# Patient Record
Sex: Male | Born: 1935 | Race: White | Hispanic: No | Smoking: Never smoker
Health system: Southern US, Community
[De-identification: ages and names within clinical notes are randomized; demographics above are authoritative.]

## PROBLEM LIST (undated history)

## (undated) DIAGNOSIS — H409 Unspecified glaucoma: Secondary | ICD-10-CM

## (undated) DIAGNOSIS — C4490 Unspecified malignant neoplasm of skin, unspecified: Secondary | ICD-10-CM

## (undated) HISTORY — PX: ESOPHAGEAL DILATION: SHX303

---

## 2003-02-07 ENCOUNTER — Ambulatory Visit (HOSPITAL_COMMUNITY): Admission: RE | Admit: 2003-02-07 | Discharge: 2003-02-07 | Payer: Self-pay | Admitting: Gastroenterology

## 2004-03-17 ENCOUNTER — Ambulatory Visit (HOSPITAL_COMMUNITY): Admission: RE | Admit: 2004-03-17 | Discharge: 2004-03-17 | Payer: Self-pay | Admitting: Gastroenterology

## 2004-03-17 ENCOUNTER — Encounter (INDEPENDENT_AMBULATORY_CARE_PROVIDER_SITE_OTHER): Payer: Self-pay | Admitting: Specialist

## 2006-12-28 ENCOUNTER — Emergency Department (HOSPITAL_COMMUNITY): Admission: EM | Admit: 2006-12-28 | Discharge: 2006-12-28 | Payer: Self-pay | Admitting: Emergency Medicine

## 2010-07-19 ENCOUNTER — Encounter: Payer: Self-pay | Admitting: Rheumatology

## 2010-11-14 NOTE — Op Note (Signed)
NAME:  CRAVEN, CREAN                         ACCOUNT NO.:  [id number]   MEDICAL RECORD NO.:  [id number]                   PATIENT TYPE:  AMB   LOCATION:  ENDO                                 FACILITY:  [facility]   PHYSICIAN:  Danise Edge, M.D.                DATE OF BIRTH:  [birth year]   DATE OF PROCEDURE:  03/17/2004  DATE OF DISCHARGE:                                 OPERATIVE REPORT   PROCEDURE:  Colonoscopy and polypectomy.   INDICATIONS:  Mr. Randy Rojas is an 77 year old male born October 23, 1919.  Mr. Randy Rojas is scheduled to undergo his first screening colonoscopy with  polypectomy to prevent colon cancer.   ENDOSCOPIST:  Danise Edge, M.D.   PREMEDICATION:  Versed 5 mg, Demerol 50 mg.   PROCEDURE:  After obtaining informed consent, Mr. Randy Rojas was placed in the  left lateral decubitus position.  I administered intravenous Demerol and  intravenous Versed to achieve conscious sedation for the procedure.  The  patient's blood pressure, oxygen saturation, and cardiac rhythm were  monitored throughout the procedure and documented in the medical record.   Anal inspection and digital rectal exam was normal.  The prostate was  nonnodular.  The Olympus adjustable pediatric colonoscope was introduced  into the rectum and advanced to the cecum.  Colonic preparation for the exam  today was satisfactory.   RECTUM:  Normal.   SIGMOID COLON/DESCENDING COLON:  Normal.   SPLENIC FLEXURE:  Normal.   TRANSVERSE COLON:  Normal.   HEPATIC FLEXURE:  Normal.   ASCENDING COLON:  From the distal ascending colon two 2 mm sessile polyps  were removed.  One polyp was removed with the electrocautery snare and the  second polyp removed with the cold biopsy forceps.   CECUM AND ILEOCECAL VALVE:  Normal.   ASSESSMENT:  Two small polyps were removed from the ascending colon,  otherwise normal screening proctocolonoscopy of the cecum.      MJ/MEDQ  D:  03/17/2004  T:  03/17/2004  Job:   [id number]   cc:   Demetria Pore. Coral Spikes, M.D.  [address]  [address]  [place]  [place] [address]  Fax: [phone]

## 2010-11-14 NOTE — Op Note (Signed)
NAME:  JAMES, SENN                         ACCOUNT NO.:  [id number]   MEDICAL RECORD NO.:  [id number]                   PATIENT TYPE:  AMB   LOCATION:  ENDO                                 FACILITY:  [facility]   PHYSICIAN:  John C. Madilyn Fireman, M.D.                 DATE OF BIRTH:  January 15, 1920   DATE OF PROCEDURE:  02/07/2003  DATE OF DISCHARGE:                                 OPERATIVE REPORT   PROCEDURE:  Esophagogastroduodenoscopy with removal of foreign body.   INDICATION FOR PROCEDURE:  An 77 year old, white, relatively healthy white  male felt an obstruction in his throat after swallowing a pill this morning.  He was unable to swallow food or keep saliva down since the procedure.  Oropharyngeal exam by Demetria Pore. Levitin, M.D. revealed no obvious foreign  body.  The patient has not had any previous episodes of food or pill  dysphagia.  He is not in any respiratory distress and not having any  odynophagia or chest pain.   DESCRIPTION OF PROCEDURE:  Oropharyngeal exam was repeated and was  unremarkable for any foreign body.  The patient was placed in the left  lateral decubitus position and placed on the pulse monitor with continuous  low-flow oxygen delivered by nasal cannula.  He was sedated with 62.5 mcg IV  fentanyl and 5 mg IV Versed.  The Olympus video endoscope was advanced  carefully into the oropharynx, and careful inspection of the vocal cords,  arytenoid cartilages and valleculae was done before attempt to intubate the  esophagus.  The scope was advanced down the right vallecula and with gentle  pressure, the mucosa yielded and revealed a greenish, round, solid object  that would not easily push forward with the scope.  I elected to use a Roth  basket to try to ensnare it, and this was partly successful and when the  snare was closed, it appeared to break the foreign body in half with one  half retained in the snare which was retrieved through the mouth.  It had a  rubbery,  greenish appearance and was probably consistent with the pill as  described by the patient.  The scope was then re-introduced back into the  same area and this time, the remaining pill fragment easily followed the  scope down into the esophagus.  There was no obvious ring or web or Zenker's  diverticulum appreciated, but I could not completely rule this out.  The  esophagus was straight and of normal caliber with the squamocolumnar line at  38 cm.  There was no visible hiatal hernia, ring, stricture, or other  abnormality of the GE junction or the esophagus.  The stomach was entered,  and a small amount of liquid secretions were suctioned from the fundus.  Retroflexed view of the cardia was unremarkable.  The fundus, body, antrum,  and pylorus all appeared normal.  The duodenum was entered,  and both the  bulb and second portion were well-inspected and appeared to be within normal  limits.  The scope was then withdrawn, and upper esophagus and hypopharynx  again carefully inspected and appeared to be free of any residual foreign  body.  The scope was then withdrawn and the patient returned to the recovery  room in stable condition.  He tolerated the procedure well, and there were  no immediate complications.   IMPRESSION:  1. A foreign body in the cervical esophagus, successfully removed.  2. No underlying ring or stricture appreciated.    PLAN:  Advance diet and if further dysphagia, we will probably obtain a  barium swallow to more conclusively rule out an upper esophageal web or  Zenker's diverticulum.                                               John C. Madilyn Fireman, M.D.    JCH/MEDQ  D:  02/07/2003  T:  02/07/2003  Job:  [id number]   cc:   Demetria Pore. Coral Spikes, M.D.  [address]  [address]  [place]  [place] [address]  Fax: [phone]

## 2011-02-09 ENCOUNTER — Other Ambulatory Visit: Payer: Self-pay | Admitting: Internal Medicine

## 2011-02-09 DIAGNOSIS — R1010 Upper abdominal pain, unspecified: Secondary | ICD-10-CM

## 2011-02-11 ENCOUNTER — Ambulatory Visit
Admission: RE | Admit: 2011-02-11 | Discharge: 2011-02-11 | Disposition: A | Discharge status: Home / Self Care | Payer: Medicare Other | Source: Ambulatory Visit | Attending: Internal Medicine | Admitting: Internal Medicine

## 2011-02-11 DIAGNOSIS — R1010 Upper abdominal pain, unspecified: Secondary | ICD-10-CM

## 2011-02-11 MED ORDER — IOHEXOL 300 MG/ML  SOLN
100.0000 mL | Freq: Once | INTRAMUSCULAR | Status: AC | PRN
  Administered 2011-02-11: 100 mL via INTRAVENOUS

## 2011-02-12 ENCOUNTER — Other Ambulatory Visit: Payer: Self-pay | Admitting: Internal Medicine

## 2011-02-12 DIAGNOSIS — K869 Disease of pancreas, unspecified: Secondary | ICD-10-CM

## 2011-02-19 ENCOUNTER — Other Ambulatory Visit: Payer: Self-pay | Admitting: Internal Medicine

## 2011-02-26 ENCOUNTER — Other Ambulatory Visit: Payer: Medicare Other

## 2011-02-27 ENCOUNTER — Ambulatory Visit
Admission: RE | Admit: 2011-02-27 | Discharge: 2011-02-27 | Disposition: A | Discharge status: Home / Self Care | Payer: Medicare Other | Source: Ambulatory Visit | Attending: Internal Medicine | Admitting: Internal Medicine

## 2011-03-19 ENCOUNTER — Ambulatory Visit (HOSPITAL_COMMUNITY): Payer: Medicare Other

## 2011-03-19 ENCOUNTER — Ambulatory Visit (HOSPITAL_COMMUNITY)
Admission: RE | Admit: 2011-03-19 | Discharge: 2011-03-19 | Disposition: A | Discharge status: Home / Self Care | Payer: Medicare Other | Source: Ambulatory Visit | Attending: Gastroenterology | Admitting: Gastroenterology

## 2011-03-19 DIAGNOSIS — R1310 Dysphagia, unspecified: Secondary | ICD-10-CM | POA: Insufficient documentation

## 2011-03-19 DIAGNOSIS — Q391 Atresia of esophagus with tracheo-esophageal fistula: Secondary | ICD-10-CM | POA: Insufficient documentation

## 2011-03-29 NOTE — Op Note (Signed)
  NAME:  Randy Rojas, Randy Rojas               ACCOUNT NO.:  [id number]  MEDICAL RECORD NO.:  [id number]  LOCATION:  WLEN                         FACILITY:  [facility]  PHYSICIAN:  Danise Edge, M.D.   DATE OF BIRTH:  Nov 03, 1919  DATE OF PROCEDURE:  03/19/2011 DATE OF DISCHARGE:                              OPERATIVE REPORT   REFERRING PHYSICIAN:  Elby Showers, M.D.  PROCEDURE:  Savary esophageal dilation.  HISTORY:  Mr. Randy Rojas is an 77 year old male, born on 1919-10-12.  The patient underwent a barium esophagram with upper GI x-ray series showing a tight cervical esophageal web.  ENDOSCOPIST:  Danise Edge, M.D.  PREMEDICATION: 1. Fentanyl 25 mcg. 2. Versed 4 mg.  PROCEDURE IN DETAIL:  The patient was placed in the left lateral decubitus position.  The Pentax gastroscope was passed through the posterior hypopharynx into the proximal esophagus revealing a tight cervical esophageal web.  I could not traverse the esophageal stricture with the gastroscope.  Under fluoroscopic guidance, the Savary dilator guidewire was passed through the gastroscope down the esophagus and into the stomach.  Under fluoroscopic guidance the 10 mm, 11 mm, and 12 mm Savary dilators were passed without resistance.  The guidewire was removed.  The Pentax gastroscope was passed through the posterior hypopharynx into the proximal esophagus without difficulty.  The hypopharynx, larynx, and vocal cords appeared normal.  Esophagoscopy:  The proximal mid and lower segments of the esophageal mucosa appeared normal.  The cervical esophageal web and had been adequately dilated.  Gastroscopy:  Retroflex view of the gastric cardia and fundus was normal.  The gastric body, antrum, and pylorus appeared normal.  Duodenoscopy:  The duodenal bulb and descending duodenum appeared normal.  ASSESSMENT:  Cervical esophageal web dilated with a 10 mm, 11 mm, and 12 mm Savary dilators.  Otherwise normal  esophagogastroduodenoscopy.          ______________________________ Danise Edge, M.D.     MJ/MEDQ  D:  03/19/2011  T:  03/19/2011  Job:  [id number]  Electronically Signed by Danise Edge M.D. on 03/29/2011 01:44:05 PM

## 2011-08-07 ENCOUNTER — Other Ambulatory Visit: Payer: Medicare Other

## 2011-08-19 ENCOUNTER — Ambulatory Visit
Admission: RE | Admit: 2011-08-19 | Discharge: 2011-08-19 | Disposition: A | Discharge status: Home / Self Care | Payer: Medicare Other | Source: Ambulatory Visit | Attending: Internal Medicine | Admitting: Internal Medicine

## 2011-08-19 DIAGNOSIS — K869 Disease of pancreas, unspecified: Secondary | ICD-10-CM

## 2011-08-19 MED ORDER — IOHEXOL 300 MG/ML  SOLN
100.0000 mL | Freq: Once | INTRAMUSCULAR | Status: AC | PRN
  Administered 2011-08-19: 100 mL via INTRAVENOUS

## 2013-01-08 ENCOUNTER — Encounter (HOSPITAL_COMMUNITY): Payer: Self-pay | Admitting: *Deleted

## 2013-01-08 ENCOUNTER — Emergency Department (HOSPITAL_COMMUNITY)
Admission: EM | Admit: 2013-01-08 | Discharge: 2013-01-08 | Disposition: A | Discharge status: Home / Self Care | Payer: Medicare Other | Attending: Emergency Medicine | Admitting: Emergency Medicine

## 2013-01-08 ENCOUNTER — Emergency Department (INDEPENDENT_AMBULATORY_CARE_PROVIDER_SITE_OTHER)
Admission: EM | Admit: 2013-01-08 | Discharge: 2013-01-08 | Disposition: A | Discharge status: Nursing Home (Includes ICF) | Payer: Medicare Other | Source: Home / Self Care | Attending: Emergency Medicine | Admitting: Emergency Medicine

## 2013-01-08 ENCOUNTER — Emergency Department (HOSPITAL_COMMUNITY): Payer: Medicare Other

## 2013-01-08 DIAGNOSIS — R51 Headache: Secondary | ICD-10-CM | POA: Insufficient documentation

## 2013-01-08 DIAGNOSIS — S1093XA Contusion of unspecified part of neck, initial encounter: Secondary | ICD-10-CM

## 2013-01-08 DIAGNOSIS — H409 Unspecified glaucoma: Secondary | ICD-10-CM | POA: Insufficient documentation

## 2013-01-08 DIAGNOSIS — S0510XA Contusion of eyeball and orbital tissues, unspecified eye, initial encounter: Secondary | ICD-10-CM | POA: Insufficient documentation

## 2013-01-08 DIAGNOSIS — Z23 Encounter for immunization: Secondary | ICD-10-CM

## 2013-01-08 DIAGNOSIS — Y999 Unspecified external cause status: Secondary | ICD-10-CM | POA: Insufficient documentation

## 2013-01-08 DIAGNOSIS — W010XXA Fall on same level from slipping, tripping and stumbling without subsequent striking against object, initial encounter: Secondary | ICD-10-CM | POA: Insufficient documentation

## 2013-01-08 DIAGNOSIS — S00209A Unspecified superficial injury of unspecified eyelid and periocular area, initial encounter: Secondary | ICD-10-CM | POA: Insufficient documentation

## 2013-01-08 DIAGNOSIS — Y929 Unspecified place or not applicable: Secondary | ICD-10-CM | POA: Insufficient documentation

## 2013-01-08 DIAGNOSIS — Y939 Activity, unspecified: Secondary | ICD-10-CM | POA: Insufficient documentation

## 2013-01-08 DIAGNOSIS — S0990XA Unspecified injury of head, initial encounter: Secondary | ICD-10-CM

## 2013-01-08 DIAGNOSIS — S0093XA Contusion of unspecified part of head, initial encounter: Secondary | ICD-10-CM

## 2013-01-08 DIAGNOSIS — Z79899 Other long term (current) drug therapy: Secondary | ICD-10-CM | POA: Insufficient documentation

## 2013-01-08 HISTORY — DX: Unspecified glaucoma: H40.9

## 2013-01-08 MED ORDER — TETANUS-DIPHTH-ACELL PERTUSSIS 5-2.5-18.5 LF-MCG/0.5 IM SUSP
INTRAMUSCULAR | Status: AC
  Filled 2013-01-08: qty 0.5

## 2013-01-08 MED ORDER — TETANUS-DIPHTH-ACELL PERTUSSIS 5-2.5-18.5 LF-MCG/0.5 IM SUSP
0.5000 mL | Freq: Once | INTRAMUSCULAR | Status: AC
  Administered 2013-01-08: 0.5 mL via INTRAMUSCULAR

## 2013-01-08 NOTE — ED Notes (Signed)
Pt sent to ED from [facility] for further eval after tripping and falling on Friday. Pt with abrasions to left side of face/head. No LOC. No vomiting. No HA. Pt is AAOx4. Pt states this happened in [place] and pt drove self back to [place] on same day. Ambulatory with cane as norm

## 2013-01-08 NOTE — ED Provider Notes (Signed)
History    CSN: [id number] Arrival date & time 01/08/13  1607  First MD Initiated Contact with Patient 01/08/13 1618     Chief Complaint  Patient presents with  . Fall    The history is provided by the patient.   patient reports falling 3 days ago.  He states his family and he were concerned about the possibility of a closed head injury and therefore they sought care at the urgent care Center today.  He was transferred to the emergency upper CT scan of his head.  He states he tripped and fell 4 to his left forehead.  He is not on anticoagulants.  Mild left-sided headache headache now.  No altered mental status.  No confusion.  Denies neck pain.  No weakness his upper lower commit.  No other complaints.  He states he may make him because his family was concerned.  He otherwise feels good.  Symptoms are only mild in severity.  Nothing worsens or improves his symptoms   Past Medical History  Diagnosis Date  . Glaucoma    History reviewed. No pertinent past surgical history. History reviewed. No pertinent family history. History  Substance Use Topics  . Smoking status: Never Smoker   . Smokeless tobacco: Not on file  . Alcohol Use: No    Review of Systems  All other systems reviewed and are negative.    Allergies  Review of patient's allergies indicates no known allergies.  Home Medications   Current Outpatient Rx  Name  Route  Sig  Dispense  Refill  . bimatoprost (LUMIGAN) 0.01 % SOLN   Both Eyes   Place 1 drop into both eyes at bedtime.         . hydroxypropyl methylcellulose (ISOPTO TEARS) 2.5 % ophthalmic solution   Both Eyes   Place 1 drop into both eyes 6 (six) times daily.         . timolol (TIMOPTIC) 0.5 % ophthalmic solution   Both Eyes   Place 1 drop into both eyes every morning.          BP 158/61  Pulse 67  Temp(Src) 97.9 F (36.6 C) (Oral)  Resp 16  SpO2 98% Physical Exam  Nursing note and vitals reviewed. Constitutional: He is oriented to  person, place, and time. He appears well-developed and well-nourished.  HENT:  Head: Normocephalic.  Small abrasion is a small hematoma over the left supraorbital rim.  Eyes: EOM are normal.  Neck: Normal range of motion.  Cardiovascular: Normal rate, regular rhythm, normal heart sounds and intact distal pulses.   Pulmonary/Chest: Effort normal and breath sounds normal. No respiratory distress.  Abdominal: Soft. He exhibits no distension. There is no tenderness.  Genitourinary: Rectum normal.  Musculoskeletal: Normal range of motion.  Neurological: He is alert and oriented to person, place, and time.  Skin: Skin is warm and dry.  Psychiatric: He has a normal mood and affect. Judgment normal.    ED Course  Procedures (including critical care time) Labs Reviewed - No data to display Ct Head Wo Contrast  01/08/2013   *RADIOLOGY REPORT*  Clinical Data: Fall 01/06/2013.  Hematoma left supraorbital region.  CT HEAD WITHOUT CONTRAST  Technique:  Contiguous axial images were obtained from the base of the skull through the vertex without contrast.  Comparison: None.  Findings: Soft tissue swelling left supraorbital region.  No underlying fracture or intracranial hemorrhage.  Small streak artifact anterior right frontal lobe.  Global atrophy without hydrocephalus.  Small vessel disease type changes without CT evidence of large acute infarct.  No intracranial mass lesion detected on this unenhanced exam.  Mastoid air cells, middle ear cavities and paranasal sinuses are clear with exception of bilateral ethmoid sinus air cells mild mucosal thickening.  Orbital structures appear grossly intact.  IMPRESSION: Soft tissue swelling left supraorbital region.  No underlying fracture or intracranial hemorrhage.   Original Report Authenticated By: Lacy Duverney, M.D.   1. Minor head injury without loss of consciousness, initial encounter     MDM  CT scan of the head is negative for acute abnormality.  Discharge  home  Lyanne Co, MD 01/08/13 1725

## 2013-01-08 NOTE — ED Provider Notes (Signed)
Chief Complaint:   Chief Complaint  Patient presents with  . Fall    History of Present Illness:   Randy Rojas is a remarkably active and healthy 79 year old male who was walking this past Friday, 3 days ago, in [place], [place] when he lost his balance and fell forward, striking his head against the ground. There was no loss of consciousness. He was able to get up immediately he drove himself back to [place]. He denies any headache, nausea, or vomiting. He has a large bruise on his left forehead and around his left eye and some of his friends told him he should come here to be rechecked. He denies any visual symptoms, blurry vision, or diplopia. He does have glaucoma and takes a drop nightly. He denies any bleeding from his nose or ears. He has no stiff neck. He denies any injury elsewhere. He's had no neurological symptoms such as paresthesias, focal muscular weakness, difficulty with equilibrium, balance, or ambulation.  Review of Systems:  Other than noted above, the patient denies any of the following symptoms: Systemic:  No fever or chills. Eye:  No eye pain, redness, diplopia or blurred vision ENT:  No bleeding from nose or ears.  No loose or broken teeth. Neck:  No pain or limited ROM. GI:  No nausea or vomiting. Neuro:  No loss of consciousness, seizure activity, numbness, tingling, or weakness.  PMFSH:  Past medical history, family history, social history, meds, and allergies were reviewed. No history of anticoagulent use.    Physical Exam:   Vital signs:  BP 145/63  Pulse 80  Temp(Src) 97.8 F (36.6 C) (Oral)  Resp 18  SpO2 99% General:  Alert and oriented times 3.  In no distress. Eye:  PERRL, full EOMs.  Lids and conjunctivas normal. Fundi benign. HEENT:  He has a large bruise on his left forehead with a small abrasion and a periorbital hematoma. There is no facial or as skull pain to palpation, no obvious deformity.  TMs and canals normal, nasal mucosa normal.  No oral  lacerations.  He has dentures and they are intact without obvious oral trauma. Neck:  Non tender.  Full ROM without pain. Neurological:  Alert and oriented.  Cranial nerves intact.  No pronator drift. Finger to nose test was normal.  No muscle weakness. DTRs were symmetrical.  Sensation was intact to light touch. Gait was normal.  Romberg's sign negative.  Able to perform tandem gait well.  Assessment:  The encounter diagnosis was Head contusion, initial encounter.  Due to his age and presented to the ED for a CT scan. He has done remarkable well and does not have any worrisome symptoms.  Plan:   1.  The following meds were prescribed:   New Prescriptions   No medications on file   2.  The patient was transported to the emergency department via shuttle.   Reuben Likes, MD 01/08/13 [phone]

## 2013-01-08 NOTE — ED Notes (Signed)
Pt  Reports    He  Larey Seat   2   Days  Ago      -  Got his feet  Tangled  Up  And  [place]  On  His  Face      He  Did  Not black out      He  denys  Any  Chest  Pain    Or  Shortness of     Breath     pearla    No  Vomiting   Pt is  Awake  And  Alert  He  cleaned the  Abrasions  On his  Face   And  Applied  Anti biotic  Cream        He  Has  Been driving   And  Went to  [place] [employer]  Today and  Was  Told  By [employer]  He  Needed  To  Be  Checked    So he  Drove  To the  Clinic     He  Has  Abrasions  And  Some  Swelling l  Side  Face

## 2013-04-20 ENCOUNTER — Ambulatory Visit: Payer: Medicare Other | Admitting: Podiatrist

## 2013-04-24 ENCOUNTER — Encounter: Payer: Self-pay | Admitting: Podiatry

## 2013-04-24 ENCOUNTER — Ambulatory Visit (INDEPENDENT_AMBULATORY_CARE_PROVIDER_SITE_OTHER): Payer: Medicare Other | Admitting: Podiatry

## 2013-04-24 VITALS — BP 159/54 | HR 81 | Resp 16

## 2013-04-24 DIAGNOSIS — M79609 Pain in unspecified limb: Secondary | ICD-10-CM

## 2013-04-24 DIAGNOSIS — B351 Tinea unguium: Secondary | ICD-10-CM

## 2013-04-25 NOTE — Progress Notes (Signed)
Subjective:     Patient ID: Randy Rojas, male   DOB: 13-Dec-1919, 79 y.o.   MRN: [id number]  HPI patient presents with nail disease and pain 1-5 bilateral with inability to cut himself   Review of Systems     Objective:   Physical Exam  Nursing note and vitals reviewed. Constitutional: He appears well-developed.  Neurological: He is alert.  Skin: Skin is warm.   Diminished pulses noted bilateral with nail disease 1-5 bilateral with thickness of the nail bed and pain noted    Assessment:     Mycotic nail infection with pain 1-5 bilateral with at risk factors    Plan:     Debridement nailbeds 1-5 bilateral no iatrogenic bleeding noted

## 2013-04-27 ENCOUNTER — Ambulatory Visit: Payer: Medicare Other | Admitting: Podiatry

## 2013-07-17 ENCOUNTER — Ambulatory Visit: Payer: Medicare Other | Admitting: Podiatry

## 2013-10-02 ENCOUNTER — Encounter (HOSPITAL_COMMUNITY): Payer: Self-pay | Admitting: Emergency Medicine

## 2013-10-02 ENCOUNTER — Emergency Department (HOSPITAL_COMMUNITY): Payer: Medicare Other

## 2013-10-02 ENCOUNTER — Emergency Department (HOSPITAL_COMMUNITY)
Admission: EM | Admit: 2013-10-02 | Discharge: 2013-10-03 | Disposition: A | Discharge status: Home / Self Care | Payer: Medicare Other | Attending: Emergency Medicine | Admitting: Emergency Medicine

## 2013-10-02 ENCOUNTER — Emergency Department (INDEPENDENT_AMBULATORY_CARE_PROVIDER_SITE_OTHER)
Admission: EM | Admit: 2013-10-02 | Discharge: 2013-10-02 | Disposition: A | Discharge status: Nursing Home (Includes ICF) | Payer: Medicare Other | Source: Home / Self Care | Attending: Family Medicine | Admitting: Family Medicine

## 2013-10-02 DIAGNOSIS — W19XXXA Unspecified fall, initial encounter: Secondary | ICD-10-CM

## 2013-10-02 DIAGNOSIS — Z8669 Personal history of other diseases of the nervous system and sense organs: Secondary | ICD-10-CM | POA: Insufficient documentation

## 2013-10-02 DIAGNOSIS — S1093XA Contusion of unspecified part of neck, initial encounter: Secondary | ICD-10-CM

## 2013-10-02 DIAGNOSIS — S0003XA Contusion of scalp, initial encounter: Secondary | ICD-10-CM

## 2013-10-02 DIAGNOSIS — S0990XA Unspecified injury of head, initial encounter: Secondary | ICD-10-CM | POA: Insufficient documentation

## 2013-10-02 DIAGNOSIS — S0083XA Contusion of other part of head, initial encounter: Secondary | ICD-10-CM

## 2013-10-02 DIAGNOSIS — S0093XA Contusion of unspecified part of head, initial encounter: Secondary | ICD-10-CM

## 2013-10-02 DIAGNOSIS — W1809XA Striking against other object with subsequent fall, initial encounter: Secondary | ICD-10-CM | POA: Insufficient documentation

## 2013-10-02 DIAGNOSIS — Y929 Unspecified place or not applicable: Secondary | ICD-10-CM | POA: Insufficient documentation

## 2013-10-02 DIAGNOSIS — Y939 Activity, unspecified: Secondary | ICD-10-CM | POA: Insufficient documentation

## 2013-10-02 DIAGNOSIS — Z79899 Other long term (current) drug therapy: Secondary | ICD-10-CM | POA: Insufficient documentation

## 2013-10-02 NOTE — ED Notes (Signed)
1708 received report from [place] Shannon RN.  1715 PA said pt. is to be transferred to ED by shuttle. Pt. Assisted to D/C desk. Walks with a cane. Gait is steady. He requests to go to the BR.  Walked pt. to BR.

## 2013-10-02 NOTE — ED Provider Notes (Signed)
CSN: [id number]     Arrival date & time 10/02/13  1732 History  This chart was scribed for non-physician practitioner, Etta Quill, NP-C working with Elyn Peers, MD by Einar Pheasant, ED scribe. This patient was seen in room TR07C/TR07C and the patient's care was started at 12:00 AM.    Chief Complaint  Patient presents with  . Fall   The history is provided by the patient. No language interpreter was used.   HPI Comments: Randy Rojas is a 80 y.o. male who presents to the Emergency Department complaining of a fall that occurred last night. Pt states that he lost his balance and hit the back of his head. He denies any LOC. Pt was seen earlier at urgent care where he received a head CT. Pt is currently able to walk but is assisted by a walking caine. Mr. Kines needs to have his CT scan read before he can be discharged. Denies any dizziness, headaches, nausea, emesis, abdominal pain, chest pain, cough, congestion, or fever.   PCP: Kandice Hams, MD   Past Medical History  Diagnosis Date  . Glaucoma    History reviewed. No pertinent past surgical history. No family history on file. History  Substance Use Topics  . Smoking status: Never Smoker   . Smokeless tobacco: Not on file  . Alcohol Use: No    Review of Systems  Constitutional: Negative for fever, chills and diaphoresis.  HENT: Negative for congestion.   Respiratory: Negative for cough and shortness of breath.   Cardiovascular: Negative for chest pain.  Gastrointestinal: Negative for nausea, vomiting and abdominal pain.  Neurological: Negative for syncope.       Fall  All other systems reviewed and are negative.      Allergies  Review of patient's allergies indicates no known allergies.  Home Medications   Current Outpatient Rx  Name  Route  Sig  Dispense  Refill  . bimatoprost (LUMIGAN) 0.01 % SOLN   Both Eyes   Place 1 drop into both eyes at bedtime.         . hydroxypropyl methylcellulose (ISOPTO  TEARS) 2.5 % ophthalmic solution   Both Eyes   Place 1 drop into both eyes 6 (six) times daily.         . timolol (TIMOPTIC) 0.5 % ophthalmic solution   Both Eyes   Place 1 drop into both eyes every morning.          BP 141/72  Pulse 65  Temp(Src) 97.7 F (36.5 C) (Oral)  Resp 18  Ht 5\' 7"  (1.702 m)  Wt 117 lb (53.071 kg)  BMI 18.32 kg/m2  SpO2 98%  Physical Exam  Nursing note and vitals reviewed. Constitutional: He appears well-developed and well-nourished. No distress.  HENT:  Head: Normocephalic and atraumatic.  Right Ear: Tympanic membrane normal.  Left Ear: Tympanic membrane normal.  Nose: Nose normal.  Mouth/Throat: Uvula is midline, oropharynx is clear and moist and mucous membranes are normal. No oropharyngeal exudate.  Eyes: Conjunctivae and EOM are normal. Pupils are equal, round, and reactive to light. Right eye exhibits no discharge. Left eye exhibits no discharge.  Neck: Normal range of motion. Neck supple. No thyromegaly present.  Cardiovascular: Normal rate, regular rhythm and normal heart sounds.  Exam reveals no gallop and no friction rub.   No murmur heard. Pulmonary/Chest: Effort normal and breath sounds normal. No respiratory distress.  Abdominal: Soft. He exhibits no distension. There is no tenderness.  Musculoskeletal: He exhibits no  edema and no tenderness.  Negative heel to shin test. Negative nose to finger test.   Lymphadenopathy:    He has no cervical adenopathy.  Neurological: He is alert.  5/5 Strength. Sensation intact.   Skin: Skin is warm and dry.  Psychiatric: He has a normal mood and affect. His behavior is normal. Thought content normal.    ED Course  Procedures (including critical care time)  DIAGNOSTIC STUDIES: Oxygen Saturation is 98% on RA, normal by my interpretation.    COORDINATION OF CARE: 12:02 AM- Pt advised of plan for treatment and pt agrees.  Labs Review Labs Reviewed - No data to display Imaging Review Ct  Head Wo Contrast  10/02/2013   CLINICAL DATA:  Status post fall striking the back of the head; no reported loss of consciousness  EXAM: CT HEAD WITHOUT CONTRAST  TECHNIQUE: Contiguous axial images were obtained from the base of the skull through the vertex without intravenous contrast.  COMPARISON:  CT HEAD W/O CM dated 01/08/2013  FINDINGS: There is moderate diffuse cerebral and cerebellar atrophy with compensatory ventriculomegaly consistent with the patient's age. This is stable. There is no evidence of an acute intracranial hemorrhage nor of an evolving ischemic infarction. The cerebellum and brainstem exhibit no acute abnormalities. There are stable calcifications in both cerebellar hemispheres. There is minimal decreased density in the deep white matter of both cerebral hemispheres consistent with chronic small vessel ischemia.  At bone window settings there is no evidence of an acute skull fracture. The observed portions of the paranasal sinuses and mastoid air cells are clear.  IMPRESSION: 1. There is no evidence of an acute intracranial hemorrhage nor other acute abnormality of the brain. 2. There are stable changes of atrophy and small vessel ischemia. 3. There is no evidence of an acute skull fracture.   Electronically Signed   By: Danaya Geddis  [place]   On: 10/02/2013 18:56     EKG Interpretation None     Patient discussed with and seen by Dr. Lemar Livings.  CT results reviewed and shared with patient.  Return precautions discussed. MDM   Final diagnoses:  None    Fall. No indication of acute intracranial abnormality.  I personally performed the services described in this documentation, which was scribed in my presence. The recorded information has been reviewed and is accurate.    Norman Herrlich, NP 10/03/13 [phone]

## 2013-10-02 NOTE — ED Notes (Signed)
Pt sent here from [facility]. Pt had a fall last night after losing his balance, hitting back of head. No loc. Pt denies headache, is not on blood thinners.

## 2013-10-02 NOTE — ED Provider Notes (Signed)
CSN: [id number]     Arrival date & time 10/02/13  1448 History   First MD Initiated Contact with Patient 10/02/13 1632     Chief Complaint  Patient presents with  . Fall   (Consider location/radiation/quality/duration/timing/severity/associated sxs/prior Treatment) HPI Comments: States he lost his balance and fell backward last night and struck his head on the floor. States he feels fine and is without symptoms. Comes to [facility] requesting a head CT to ensure that he has not suffered "a blood clot in my head" as a result of the fall.   Patient is a 80 y.o. male presenting with fall. The history is provided by the patient.  Fall    Past Medical History  Diagnosis Date  . Glaucoma    History reviewed. No pertinent past surgical history. History reviewed. No pertinent family history. History  Substance Use Topics  . Smoking status: Never Smoker   . Smokeless tobacco: Not on file  . Alcohol Use: No    Review of Systems  Constitutional: Negative.   HENT: Negative.   Eyes: Negative.   Respiratory: Negative.   Cardiovascular: Negative.   Gastrointestinal: Negative.   Musculoskeletal: Negative.   Neurological: Negative.   Hematological: Does not bruise/bleed easily.  Psychiatric/Behavioral: Negative for confusion, decreased concentration and agitation.    Allergies  Review of patient's allergies indicates no known allergies.  Home Medications   Current Outpatient Rx  Name  Route  Sig  Dispense  Refill  . bimatoprost (LUMIGAN) 0.01 % SOLN   Both Eyes   Place 1 drop into both eyes at bedtime.         . hydroxypropyl methylcellulose (ISOPTO TEARS) 2.5 % ophthalmic solution   Both Eyes   Place 1 drop into both eyes 6 (six) times daily.         . timolol (TIMOPTIC) 0.5 % ophthalmic solution   Both Eyes   Place 1 drop into both eyes every morning.          BP 151/59  Pulse 65  Temp(Src) 98.3 F (36.8 C) (Oral)  Resp 16  SpO2 100% Physical Exam  Nursing note and  vitals reviewed. Constitutional: He is oriented to person, place, and time. He appears well-developed and well-nourished. No distress.  HENT:  Head: Normocephalic and atraumatic.  Right Ear: Hearing and external ear normal.  Left Ear: Hearing and external ear normal.  Nose: Nose normal.  Eyes: Conjunctivae and EOM are normal. Pupils are equal, round, and reactive to light.  Neck: Normal range of motion. Neck supple.  Cardiovascular: Normal rate.   Pulmonary/Chest: Effort normal.  Musculoskeletal: Normal range of motion.  Neurological: He is alert and oriented to person, place, and time.  Skin: Skin is warm and dry.  Psychiatric: He has a normal mood and affect. His behavior is normal.    ED Course  Procedures (including critical care time) Labs Review Labs Reviewed - No data to display Imaging Review No results found.   MDM   1. Contusion of head    Explained to patient that we did not have CT capabilities here at the [facility] and if he wished to proceed with this type of study he would need to be seen at the hospital. He states, "That sounds good. Let's go." Will transfer to [facility] via shuttle.   [place], [place] 10/02/13 [phone]

## 2013-10-02 NOTE — ED Notes (Signed)
C/o falling yesterday while fixing food States he fell start backwards and hit his head

## 2013-10-02 NOTE — ED Provider Notes (Signed)
Medical screening examination/treatment/procedure(s) were performed by resident physician or non-physician practitioner and as supervising physician I was immediately available for consultation/collaboration.   Keierra Nudo DOUGLAS MD.   Zameer Borman D Zorianna Taliaferro, MD 10/02/13 2121 

## 2013-10-03 NOTE — Discharge Instructions (Signed)
Head Injury, Adult You have a head injury. Headaches and throwing up (vomiting) are common after a head injury. It should be easy to wake up from sleeping. Sometimes you must stay in the hospital. Most problems happen within the first 24 hours. Side effects may occur up to 7 10 days after the injury.  WHAT ARE THE TYPES OF HEAD INJURIES? Head injuries can be as minor as a bump. Some head injuries can be more severe. More severe head injuries include:  A jarring injury to the brain (concussion).  A bruise of the brain (contusion). This mean there is bleeding in the brain that can cause swelling.  A cracked skull (skull fracture).  Bleeding in the brain that collects, clots, and forms a bump (hematoma). . WHEN SHOULD I GET HELP RIGHT AWAY?   You are confused or sleepy.  You cannot be woken up.  You feel sick to your stomach (nauseous) or keep throwing up.  Your dizziness or unsteadiness is get worse.  You have very bad, lasting headaches that are not helped by medicine.  You cannot use your arms or legs like normal  You cannot walk.  You notice changes in the black spots in the center of the colored part of your eye (pupil).  You have clear or bloody fluid coming from your nose or ears.  You have trouble seeing. During the next 24 hours after the injury, you must stay with someone who can watch you. This person should get help right away (call 911 in the [place] if you start to shake and are not able to control it (seizures), you become pass out, or you are unable to wake up. HOW CAN I PREVENT A HEAD INJURY IN THE FUTURE?  Wear seat belts.  Wear helmets while bike riding and playing sports like football.  Stay away from dangerous activities around the house. WHEN CAN I RETURN TO NORMAL ACTIVITIES AND ATHLETICS? See your doctor before doing these activities. You should not do normal activities or play contact sports until 1 week after the following symptoms have  stopped:  Headache that does not go away.  Dizziness.  Poor attention.  Confusion.  Memory problems.  Sickness to your stomach or throwing up.  Tiredness.  Fussiness.  Bothered by bright lights or loud noises.  Anxiousness or depression.  Restless sleep. MAKE SURE YOU:   Understand these instructions.  Will watch your condition.  Will get help right away if you are not doing well or get worse. Document Released: 05/28/2008 Document Revised: 04/05/2013 Document Reviewed: 02/20/2013 [facility] Patient Information 2014 [employer].

## 2013-10-03 NOTE — ED Provider Notes (Signed)
Medical screening examination/treatment/procedure(s) were performed by non-physician practitioner and as supervising physician I was immediately available for consultation/collaboration.   EKG Interpretation None        Elyn Peers, MD 10/03/13 [phone]

## 2015-03-15 ENCOUNTER — Encounter (HOSPITAL_COMMUNITY): Payer: Self-pay | Admitting: *Deleted

## 2015-03-15 ENCOUNTER — Emergency Department (INDEPENDENT_AMBULATORY_CARE_PROVIDER_SITE_OTHER): Payer: Medicare Other

## 2015-03-15 ENCOUNTER — Emergency Department (INDEPENDENT_AMBULATORY_CARE_PROVIDER_SITE_OTHER)
Admission: EM | Admit: 2015-03-15 | Discharge: 2015-03-15 | Disposition: A | Discharge status: Home / Self Care | Payer: Medicare Other | Source: Home / Self Care | Attending: Family Medicine | Admitting: Family Medicine

## 2015-03-15 DIAGNOSIS — S20222A Contusion of left back wall of thorax, initial encounter: Secondary | ICD-10-CM

## 2015-03-15 NOTE — ED Provider Notes (Signed)
CSN: [id number]     Arrival date & time 03/15/15  1518 History   First MD Initiated Contact with Patient 03/15/15 1637     Chief Complaint  Patient presents with  . Fall   (Consider location/radiation/quality/duration/timing/severity/associated sxs/prior Treatment) Patient is a 81 y.o. male presenting with fall. The history is provided by the patient.  Fall The current episode started 2 days ago. The problem has not changed since onset.Associated symptoms include chest pain. Pertinent negatives include no abdominal pain and no shortness of breath.    Past Medical History  Diagnosis Date  . Glaucoma    History reviewed. No pertinent past surgical history. History reviewed. No pertinent family history. Social History  Substance Use Topics  . Smoking status: Never Smoker   . Smokeless tobacco: None  . Alcohol Use: No    Review of Systems  Constitutional: Negative.   Respiratory: Negative for shortness of breath.   Cardiovascular: Positive for chest pain. Negative for palpitations and leg swelling.  Gastrointestinal: Negative.  Negative for abdominal pain.  Genitourinary: Negative.   All other systems reviewed and are negative.   Allergies  Review of patient's allergies indicates no known allergies.  Home Medications   Prior to Admission medications   Medication Sig Start Date End Date Taking? Authorizing Provider  bimatoprost (LUMIGAN) 0.01 % SOLN Place 1 drop into both eyes at bedtime.    Historical Provider, MD  hydroxypropyl methylcellulose (ISOPTO TEARS) 2.5 % ophthalmic solution Place 1 drop into both eyes 6 (six) times daily.    Historical Provider, MD  timolol (TIMOPTIC) 0.5 % ophthalmic solution Place 1 drop into both eyes every morning.    Historical Provider, MD   Meds Ordered and Administered this Visit  Medications - No data to display  BP 155/62 mmHg  Pulse 65  Temp(Src) 97.5 F (36.4 C) (Oral)  Resp 16  SpO2 100% No data found.   Physical Exam    Constitutional: He is oriented to person, place, and time. He appears well-developed and well-nourished. No distress.  Pulmonary/Chest: Effort normal and breath sounds normal.   He exhibits tenderness.  Abdominal: Soft. Bowel sounds are normal.  Neurological: He is alert and oriented to person, place, and time.  Skin: Skin is warm and dry.  Nursing note and vitals reviewed.   ED Course  Procedures (including critical care time)  Labs Review Labs Reviewed - No data to display  Imaging Review Dg Ribs Unilateral W/chest Left  03/15/2015   CLINICAL DATA:  Fall 2 days ago with left back pain and hematoma over left posterior lower ribs.  EXAM: LEFT RIBS AND CHEST - 3+ VIEW  COMPARISON:  None.  FINDINGS: Lungs are adequately inflated and otherwise clear. Cardiomediastinal silhouette is within normal. There is moderate calcified plaque over the aortic arch. There are moderate degenerative changes of the spine. No definite left rib fracture.  IMPRESSION: No acute findings.   Electronically Signed   By: Marin Olp M.D.   On: 03/15/2015 17:28   X-rays reviewed and report per radiologist.   Visual Acuity Review  Right Eye Distance:   Left Eye Distance:   Bilateral Distance:    Right Eye Near:   Left Eye Near:    Bilateral Near:         MDM   1. Contusion, back, left, initial encounter       Billy Fischer, MD 03/15/15 2007

## 2015-03-15 NOTE — Discharge Instructions (Signed)
Use heat and advil as needed for soreness, see your doctor as needed.

## 2015-03-15 NOTE — ED Notes (Signed)
Pt  Reports     He  [address]     sev  Days  Ago  And  Injured his  l  Side  -  He  Has  Bruising       And  A  Well  Healing   Abrasion to  l  Flank  Area        he  Ambulated  To      Room     He  Is  Sitting  Upright on the  Exam table  Speaking in   Complete  sentances  And     Is in no acute  Distress

## 2016-01-15 ENCOUNTER — Ambulatory Visit: Payer: Medicare Other | Admitting: Podiatry

## 2016-01-18 ENCOUNTER — Encounter (HOSPITAL_COMMUNITY): Payer: Self-pay | Admitting: Emergency Medicine

## 2016-01-18 ENCOUNTER — Emergency Department (HOSPITAL_COMMUNITY)
Admission: EM | Admit: 2016-01-18 | Discharge: 2016-01-18 | Disposition: A | Discharge status: Home / Self Care | Payer: Medicare Other | Attending: Emergency Medicine | Admitting: Emergency Medicine

## 2016-01-18 ENCOUNTER — Encounter (HOSPITAL_COMMUNITY): Payer: Self-pay

## 2016-01-18 ENCOUNTER — Ambulatory Visit (HOSPITAL_COMMUNITY)
Admission: EM | Admit: 2016-01-18 | Discharge: 2016-01-18 | Disposition: A | Discharge status: Nursing Home (Includes ICF) | Payer: Medicare Other | Attending: Family Medicine | Admitting: Family Medicine

## 2016-01-18 DIAGNOSIS — L03116 Cellulitis of left lower limb: Secondary | ICD-10-CM | POA: Insufficient documentation

## 2016-01-18 DIAGNOSIS — L089 Local infection of the skin and subcutaneous tissue, unspecified: Secondary | ICD-10-CM | POA: Diagnosis present

## 2016-01-18 LAB — CBC WITH DIFFERENTIAL/PLATELET
BASOS PCT: 0 %
Basophils Absolute: 0 10*3/uL (ref 0.0–0.1)
EOS ABS: 0.1 10*3/uL (ref 0.0–0.7)
Eosinophils Relative: 3 %
HEMATOCRIT: 40.8 % (ref 39.0–52.0)
HEMOGLOBIN: 13.3 g/dL (ref 13.0–17.0)
LYMPHS ABS: 2 10*3/uL (ref 0.7–4.0)
Lymphocytes Relative: 36 %
MCH: 30.1 pg (ref 26.0–34.0)
MCHC: 32.6 g/dL (ref 30.0–36.0)
MCV: 92.3 fL (ref 78.0–100.0)
Monocytes Absolute: 0.4 10*3/uL (ref 0.1–1.0)
Monocytes Relative: 8 %
NEUTROS ABS: 3 10*3/uL (ref 1.7–7.7)
NEUTROS PCT: 53 %
Platelets: 125 10*3/uL — ABNORMAL LOW (ref 150–400)
RBC: 4.42 MIL/uL (ref 4.22–5.81)
RDW: 13.7 % (ref 11.5–15.5)
WBC: 5.6 10*3/uL (ref 4.0–10.5)

## 2016-01-18 LAB — COMPREHENSIVE METABOLIC PANEL
ALT: 13 U/L — AB (ref 17–63)
AST: 21 U/L (ref 15–41)
Albumin: 3.8 g/dL (ref 3.5–5.0)
Alkaline Phosphatase: 74 U/L (ref 38–126)
Anion gap: 6 (ref 5–15)
BUN: 12 mg/dL (ref 6–20)
CHLORIDE: 109 mmol/L (ref 101–111)
CO2: 25 mmol/L (ref 22–32)
CREATININE: 0.9 mg/dL (ref 0.61–1.24)
Calcium: 11.1 mg/dL — ABNORMAL HIGH (ref 8.9–10.3)
GFR calc Af Amer: >60 mL/min (ref >60)
Glucose, Bld: 81 mg/dL (ref 65–99)
POTASSIUM: 4.1 mmol/L (ref 3.5–5.1)
SODIUM: 140 mmol/L (ref 135–145)
Total Bilirubin: 0.8 mg/dL (ref 0.3–1.2)
Total Protein: 6.6 g/dL (ref 6.5–8.1)

## 2016-01-18 LAB — I-STAT BETA HCG BLOOD, ED (MC, WL, AP ONLY)

## 2016-01-18 LAB — I-STAT CG4 LACTIC ACID, ED: Lactic Acid, Venous: 1.35 mmol/L (ref 0.5–1.9)

## 2016-01-18 MED ORDER — CEPHALEXIN 250 MG/5ML PO SUSR
500.0000 mg | Freq: Three times a day (TID) | ORAL | Status: AC
Start: 2016-01-18 — End: 2016-01-25

## 2016-01-18 MED ORDER — VANCOMYCIN HCL IN DEXTROSE 1-5 GM/200ML-% IV SOLN: 1000.0000 mg | Freq: Once | INTRAVENOUS | Status: DC

## 2016-01-18 MED ORDER — CEPHALEXIN 250 MG/5ML PO SUSR
500.0000 mg | Freq: Once | ORAL | Status: AC
  Administered 2016-01-18: 500 mg via ORAL
  Filled 2016-01-18: qty 10

## 2016-01-18 NOTE — ED Provider Notes (Signed)
CSN: [id number]     Arrival date & time 01/18/16  1214 History   First MD Initiated Contact with Patient 01/18/16 1255     Chief Complaint  Patient presents with  . Wound Check    Patient is a 82 y.o. male presenting with wound check. The history is provided by the patient.  Wound Check This is a new problem. The current episode started more than 1 week ago. The problem occurs constantly. The problem has been gradually worsening. Pertinent negatives include no chest pain, no abdominal pain, no headaches and no shortness of breath.  Pt is not a reliable historian as he is unable to give clear answers to questions. Reports he fell 3 weeks ago and injured his (L) lower leg. Since he has noted increasing redness and pain. He saw Dr Delfina Redwood at [facility] on 01/13/2016 and was placed on Keflex for the LLE. Pt has only taken 1 tablet and seems unclear as to how he should be taking it. Pt states he is so old that his mind doesn't work right some times.  Past Medical History  Diagnosis Date  . Glaucoma    History reviewed. No pertinent past surgical history. No family history on file. Social History  Substance Use Topics  . Smoking status: Never Smoker   . Smokeless tobacco: None  . Alcohol Use: No    Review of Systems  Respiratory: Negative for shortness of breath.   Cardiovascular: Negative for chest pain.  Gastrointestinal: Negative for abdominal pain.  Neurological: Negative for headaches.  All other systems reviewed and are negative.   Allergies  Review of patient's allergies indicates no known allergies.  Home Medications   Prior to Admission medications   Medication Sig Start Date End Date Taking? Authorizing Provider  CEPHALEXIN PO Take by mouth.   Yes Historical Provider, MD  bimatoprost (LUMIGAN) 0.01 % SOLN Place 1 drop into both eyes at bedtime.    Historical Provider, MD  hydroxypropyl methylcellulose (ISOPTO TEARS) 2.5 % ophthalmic solution Place 1 drop into  both eyes 6 (six) times daily.    Historical Provider, MD  timolol (TIMOPTIC) 0.5 % ophthalmic solution Place 1 drop into both eyes every morning.    Historical Provider, MD   Meds Ordered and Administered this Visit  Medications - No data to display  BP 154/52 mmHg  Pulse 73  Temp(Src) 97.7 F (36.5 C) (Oral)  Resp 16  SpO2 100% No data found.   Physical Exam  Constitutional: He appears well-developed and well-nourished.  HENT:  Head: Normocephalic and atraumatic.  Eyes: Conjunctivae are normal.  Cardiovascular: Normal rate.   Pulmonary/Chest: Effort normal.  Neurological: He is alert.  Skin: Skin is warm and dry.  Multiple scabs to pt's face arms and hands. Toenails grossly unkept.  LLE is very erythematous, somewhat swollen and warm to touch. There are multiple open areas draining pus like drainage. Dopplered pulses present.   Psychiatric: He has a normal mood and affect.  Nursing note and vitals reviewed.   ED Course  Procedures (including critical care time)  Labs Review Labs Reviewed - No data to display  Imaging Review No results found.   Visual Acuity Review  Right Eye Distance:   Left Eye Distance:   Bilateral Distance:    Right Eye Near:   Left Eye Near:    Bilateral Near:         MDM   1. Cellulitis of left lower leg   Suspect pt unable  to properly care for his celluiltic LLE. Unclear as to how he should be taking his meds and does not appear to be doing anything else. Pt will be sent to [facility] ED for further eval and possible admission for IV antibiotics. Pt agreeable w/ plan.     Jeryl Columbia, NP 01/18/16 1348

## 2016-01-18 NOTE — ED Notes (Signed)
patient here from urgent care for further evaluation of left lower leg. States that he fell 3 weeks ago and has ongoing oozing wound and redness to leg. Unsure if patient taking meds as directed. Lives alone. Alert and oriented

## 2016-01-18 NOTE — Discharge Instructions (Signed)
Return to the ED with any concerns including fever, increased redness of leg, pus draining, vomiting and not able to keep down antibitoics, decreased level of alertness/lethargy, or any other alarming symptoms  You should be sure to take all of the antibiotic- take it three times every day until it is gone

## 2016-01-18 NOTE — ED Provider Notes (Signed)
CSN: [id number]     Arrival date & time 01/18/16  1405 History   First MD Initiated Contact with Patient 01/18/16 1646     Chief Complaint  Patient presents with  . fall/leg infection      (Consider location/radiation/quality/duration/timing/severity/associated sxs/prior Treatment) HPI  Pt presents after fall several weeks ago- he was seen at urgent care and referred for evaluation of left lower extremity cellulitis.  He states the redness of his leg started after he scraped his leg.  He states he was putting neosporin ointment on it and saw a doctor who prescribed another cream.  He saw another doctor this week and was prescribed keflex.  He states he took the first dose yesterday.  This morning he thought he felt a lump in leg and went to urgent care for further evaluation.  No fever/chills.  No vomiting.  He states the keflex pills are hard for him to swallow.  Today he states he has not taken any pills because he has been at urgent care and here in the ED. He state the redness appears to be improving compared to yesterday.  He states he feels he could take his medication better if it were in liquid form.  He does not want to be hospitalized.  There are no other associated systemic symptoms, there are no other alleviating or modifying factors.   Past Medical History  Diagnosis Date  . Glaucoma    History reviewed. No pertinent past surgical history. No family history on file. Social History  Substance Use Topics  . Smoking status: Never Smoker   . Smokeless tobacco: None  . Alcohol Use: No    Review of Systems  ROS reviewed and all otherwise negative except for mentioned in HPI    Allergies  Review of patient's allergies indicates no known allergies.  Home Medications   Prior to Admission medications   Medication Sig Start Date End Date Taking? Authorizing Provider  hydrocortisone ointment 0.5 % Apply 1 application topically 2 (two) times daily as needed for itching.   Yes  Historical Provider, MD  timolol (TIMOPTIC) 0.5 % ophthalmic solution Place 1 drop into both eyes every morning.   Yes Historical Provider, MD  cephALEXin (KEFLEX) 250 MG/5ML suspension Take 10 mLs (500 mg total) by mouth 3 (three) times daily. 01/18/16 01/25/16  Alfonzo Beers, MD   BP 134/79 mmHg  Pulse 82  Temp(Src) 97.8 F (36.6 C) (Oral)  Resp 16  SpO2 100%  Vitals reviewed Physical Exam  Physical Examination: General appearance - alert, cachectic appearing, and in no distress Mental status - alert, oriented to person, place, and time Eyes - no conjunctival injection no scleral icterus Chest - clear to auscultation, no wheezes, rales or rhonchi, symmetric air entry Heart - normal rate, regular rhythm, normal S1, S2, no murmurs, rubs, clicks or gallops Neurological - alert, oriented x 3, normal speech Musculoskeletal - no joint tenderness, deformity or swelling Extremities - peripheral pulses normal, no pedal edema, no clubbing or cyanosis Skin - normal coloration and turgor, no rashes- except on anterior left lower extremity there are 2 areas of erythema surrounding ulcerations- ulcers with granulation tissue are approx 1 cm, are of erythema approx 5-6cm in diameter, skin is warm to touch, no streaking up leg, no area of fluctuance   ED Course  Procedures (including critical care time) Labs Review Labs Reviewed  COMPREHENSIVE METABOLIC PANEL - Abnormal; Notable for the following:    Calcium 11.1 (*)    ALT  13 (*)    All other components within normal limits  CBC WITH DIFFERENTIAL/PLATELET - Abnormal; Notable for the following:    Platelets 125 (*)    All other components within normal limits  I-STAT BETA HCG BLOOD, ED ([facility], [facility], [facility] ONLY)  I-STAT CG4 LACTIC ACID, ED    Imaging Review No results found. I have personally reviewed and evaluated these images and lab results as part of my medical decision-making.   EKG Interpretation None      MDM   Final diagnoses:   Cellulitis of left lower extremity    Pt presenting with cellulitis of left lower extremity- he was started on keflex and states he has taken one dose due to having difficulty swallowing it- requests a liquid formulation.  His labs are reassuring, no elevation of WBC.  He does not want to be admitted- I have advised him to stay for IV antibiotics.  He does not want to be admitted and states he can take the antibitoics in liquid form. I have consulted case management as well.    5:30 PM pt is requesting to leave- I have encouraged him to stay for admission which he is unwilling to do.  I have ordered a dose of liquid keflex to make sure he can take that, then will give rx for liquid.  He is not willing to stay in the hospital.  I have also placed a consult for case management.      Alfonzo Beers, MD 01/18/16 1946

## 2016-01-18 NOTE — ED Notes (Signed)
Pt given urinal.

## 2016-01-18 NOTE — ED Notes (Signed)
Dr Canary Brim in room

## 2016-01-18 NOTE — ED Notes (Signed)
Patient reports fall 3 weeks ago.  Patient has been seen by several physicians and reports someone working on a home health nurse-but no assistance yet.  Patient has no family, lives alone.  Patient not reliable in taking medicines.  He has an antibiotic pills with him, says he takes them, but not sure if correctly.  Reports sitting and dozing 2-3 hours at a time.  Patient has red lower left leg, open wounds, one with white tissue on shin and one  with white tissue on top of foot.  Otherwise lower leg is red, warm.

## 2016-01-20 ENCOUNTER — Telehealth: Payer: Self-pay | Admitting: *Deleted

## 2016-01-21 ENCOUNTER — Telehealth: Payer: Self-pay | Admitting: *Deleted

## 2016-01-24 ENCOUNTER — Ambulatory Visit: Payer: Medicare Other | Admitting: Podiatry

## 2016-02-12 ENCOUNTER — Ambulatory Visit: Payer: Medicare Other | Admitting: Podiatry

## 2016-02-14 ENCOUNTER — Ambulatory Visit (INDEPENDENT_AMBULATORY_CARE_PROVIDER_SITE_OTHER): Payer: Medicare Other | Admitting: Podiatry

## 2016-02-14 ENCOUNTER — Encounter: Payer: Self-pay | Admitting: Podiatry

## 2016-02-14 ENCOUNTER — Emergency Department (HOSPITAL_COMMUNITY): Payer: Medicare Other

## 2016-02-14 ENCOUNTER — Encounter (HOSPITAL_COMMUNITY): Payer: Self-pay | Admitting: *Deleted

## 2016-02-14 ENCOUNTER — Emergency Department (HOSPITAL_COMMUNITY)
Admission: EM | Admit: 2016-02-14 | Discharge: 2016-02-14 | Disposition: A | Discharge status: Home / Self Care | Payer: Medicare Other | Attending: Emergency Medicine | Admitting: Emergency Medicine

## 2016-02-14 DIAGNOSIS — M79676 Pain in unspecified toe(s): Secondary | ICD-10-CM

## 2016-02-14 DIAGNOSIS — L03116 Cellulitis of left lower limb: Secondary | ICD-10-CM

## 2016-02-14 DIAGNOSIS — R609 Edema, unspecified: Secondary | ICD-10-CM

## 2016-02-14 DIAGNOSIS — M7989 Other specified soft tissue disorders: Secondary | ICD-10-CM | POA: Diagnosis present

## 2016-02-14 DIAGNOSIS — B351 Tinea unguium: Secondary | ICD-10-CM | POA: Diagnosis not present

## 2016-02-14 LAB — BRAIN NATRIURETIC PEPTIDE: B Natriuretic Peptide: 39.7 pg/mL (ref 0.0–100.0)

## 2016-02-14 LAB — CBC
HCT: 36.6 % — ABNORMAL LOW (ref 39.0–52.0)
HEMOGLOBIN: 11.8 g/dL — AB (ref 13.0–17.0)
MCH: 29.6 pg (ref 26.0–34.0)
MCHC: 32.2 g/dL (ref 30.0–36.0)
MCV: 91.7 fL (ref 78.0–100.0)
Platelets: 121 10*3/uL — ABNORMAL LOW (ref 150–400)
RBC: 3.99 MIL/uL — ABNORMAL LOW (ref 4.22–5.81)
RDW: 13.9 % (ref 11.5–15.5)
WBC: 4.1 10*3/uL (ref 4.0–10.5)

## 2016-02-14 LAB — BASIC METABOLIC PANEL
ANION GAP: 5 (ref 5–15)
BUN: 19 mg/dL (ref 6–20)
CALCIUM: 12 mg/dL — AB (ref 8.9–10.3)
CO2: 28 mmol/L (ref 22–32)
Chloride: 103 mmol/L (ref 101–111)
Creatinine, Ser: 0.99 mg/dL (ref 0.61–1.24)
GFR calc Af Amer: >60 mL/min (ref >60)
GLUCOSE: 139 mg/dL — AB (ref 65–99)
Potassium: 3.6 mmol/L (ref 3.5–5.1)
SODIUM: 136 mmol/L (ref 135–145)

## 2016-02-14 LAB — TROPONIN I

## 2016-02-14 LAB — I-STAT CG4 LACTIC ACID, ED: LACTIC ACID, VENOUS: 1.72 mmol/L (ref 0.5–1.9)

## 2016-02-14 MED ORDER — CEPHALEXIN 250 MG/5ML PO SUSR: 500.0000 mg | Freq: Three times a day (TID) | ORAL | 0 refills | Status: AC

## 2016-02-14 MED ORDER — CEPHALEXIN 250 MG/5ML PO SUSR
500.0000 mg | Freq: Once | ORAL | Status: AC
  Administered 2016-02-14: 500 mg via ORAL
  Filled 2016-02-14: qty 10

## 2016-02-14 NOTE — Progress Notes (Signed)
This patient presents the office with chief complaint of long thick painful nails. He was last seen in this office over 3 years ago by Dr. Paulla Dolly. He has not had his nails worked on  since that visit  A.  This patient presents the office saying that he fell and that is the reason why his sock is yellow  and he has bandages up his leg. He also has red inflammation noted on the top of his left foot but no increased temperature or drainage noted from the foot  according to this patient.   He presents the office today for an evaluation and treatment of his feet   GENERAL APPEARANCE: Alert, conversant. Appropriately groomed. No acute distress.  VASCULAR: Pedal pulses are  diminished at  [facility] and PT bilateral.  Capillary refill time is immediate to all digits,  Cold feet noted.  NEUROLOGIC: sensation is normal to 5.07 monofilament at 5/5 sites bilateral.  Light touch is intact bilateral, Muscle strength normal.  MUSCULOSKELETAL: acceptable muscle strength, tone and stability bilateral.  Intrinsic muscluature intact bilateral.  Rectus appearance of foot and digits noted bilateral. Swelling noted feet B/L  DERMATOLOGIC: This patient presents with redness noted on the dorsum of his left foot. There is redness and drainage noted from the left lower leg. He has bandages on the front of his left leg. He has venous stasis skin noted on the anterior aspect of the left leg. He also has the absence of skin on the back of his leg.   Onychomycosis  Venous stasis left leg.  IE  Debridement of nails B/L  as we helped him. His sock on his left foot. His sock was yellow and black. Therefore we applied a dry sterile dressing to his left lower leg and a stockinette on his left leg. I told him he is having the drainage from the venous stasis problem and he needs to be seen by his medical doctor for treatment. He was told to return to the office in 3 months for continued nail care.  Gardiner Barefoot DPM

## 2016-02-14 NOTE — ED Triage Notes (Signed)
The pt reports that his legs especially his lt has been swollen for 3 weeks  He was sent here from his doctors office today  He has sores on his legs

## 2016-02-14 NOTE — ED Notes (Signed)
Pt understood dc material. NAD noted. Scripts and papers given at [employer]

## 2016-02-14 NOTE — ED Provider Notes (Signed)
[place] DEPT Provider Note   CSN: [id number] Arrival date & time: 02/14/16  1533     History   Chief Complaint Chief Complaint  Patient presents with  . Leg Swelling    HPI Randy Rojas is a 82 y.o. male.   Rash   This is a recurrent problem. The current episode started more than 2 days ago. The problem has been gradually improving. The problem is associated with nothing. There has been no fever. The rash is present on the left lower leg. The pain is mild. The pain has been constant since onset. Associated symptoms include pain. Pertinent negatives include no weeping. He has tried nothing for the symptoms.    Past Medical History:  Diagnosis Date  . Glaucoma     There are no active problems to display for this patient.   History reviewed. No pertinent surgical history.     Home Medications    Prior to Admission medications   Medication Sig Start Date End Date Taking? Authorizing Provider  bimatoprost (LUMIGAN) 0.01 % SOLN Place 1 drop into both eyes at bedtime.   Yes Historical Provider, MD  brimonidine-timolol (COMBIGAN) 0.2-0.5 % ophthalmic solution Place 1 drop into both eyes 2 (two) times daily.   Yes Historical Provider, MD  dorzolamide (TRUSOPT) 2 % ophthalmic solution Place 1 drop into both eyes 2 (two) times daily.  02/02/16  Yes Historical Provider, MD  ENSURE (ENSURE) Take 237 mLs by mouth See admin instructions. Drink 1 bottle (237 ml) by mouth up to 4 times daily   Yes Historical Provider, MD  timolol (TIMOPTIC) 0.5 % ophthalmic solution Place 1 drop into both eyes daily.    Yes Historical Provider, MD  cephALEXin (KEFLEX) 250 MG/5ML suspension Take 10 mLs (500 mg total) by mouth 3 (three) times daily. 02/14/16 03/06/16  Merrily Pew, MD    Family History No family history on file.  Social History Social History  Substance Use Topics  . Smoking status: Never Smoker  . Smokeless tobacco: Never Used  . Alcohol use No     Allergies   Review  of patient's allergies indicates no known allergies.   Review of Systems Review of Systems  Constitutional: Negative for chills and fever.  Respiratory: Negative for cough and shortness of breath.   Cardiovascular: Negative for chest pain.  Gastrointestinal: Negative for abdominal pain, nausea and vomiting.  Skin: Positive for rash.  All other systems reviewed and are negative.    Physical Exam Updated Vital Signs BP 143/90   Pulse 71   Temp 97.8 F (36.6 C)   Resp 16   Ht 5\' 6"  (1.676 m)   Wt 120 lb (54.4 kg)   SpO2 100%   BMI 19.37 kg/m   Physical Exam  Constitutional: He appears well-developed and well-nourished.  HENT:  Head: Normocephalic and atraumatic.  Eyes: Conjunctivae are normal.  Neck: Neck supple.  Cardiovascular: Normal rate and regular rhythm.   No murmur heard. Pulmonary/Chest: Effort normal and breath sounds normal. No respiratory distress.  Abdominal: Soft. There is no tenderness.  Musculoskeletal: He exhibits no edema.  Neurological: He is alert.  Skin: Skin is warm and dry. Rash (erythema, warmth, induration up to mid shin on left lower extremity, slightly ttp) noted. There is erythema.  Psychiatric: He has a normal mood and affect.  Nursing note and vitals reviewed.    ED Treatments / Results  Labs (all labs ordered are listed, but only abnormal results are displayed) Labs Reviewed  BASIC METABOLIC PANEL - Abnormal; Notable for the following:       Result Value   Glucose, Bld 139 (*)    Calcium 12.0 (*)    All other components within normal limits  CBC - Abnormal; Notable for the following:    RBC 3.99 (*)    Hemoglobin 11.8 (*)    HCT 36.6 (*)    Platelets 121 (*)    All other components within normal limits  TROPONIN I  BRAIN NATRIURETIC PEPTIDE  I-STAT CG4 LACTIC ACID, ED    EKG  EKG Interpretation None       Radiology Dg Chest 2 View  Result Date: 02/14/2016 CLINICAL DATA:  Swollen lower extremities. EXAM: CHEST  2  VIEW COMPARISON:  March 15, 2015 FINDINGS: Flattening of the diaphragms is consistent with hyperinflation. No other acute abnormalities. IMPRESSION: Hyperinflation of the lungs.  No other acute abnormalities. Electronically Signed   By: Dorise Bullion III M.D   On: 02/14/2016 17:41    Procedures Procedures (including critical care time)  Medications Ordered in ED Medications  cephALEXin (KEFLEX) 250 MG/5ML suspension 500 mg (500 mg Oral Given 02/14/16 2206)     Initial Impression / Assessment and Plan / ED Course  I have reviewed the triage vital signs and the nursing notes.  Pertinent labs & imaging results that were available during my care of the patient were reviewed by me and considered in my medical decision making (see chart for details).  Clinical Course    82 year old male with chronic venous stasis of both legs but new erythema around his venous stasis on the left. With his prior doctor sent him here. Here his labs look okay as vitals are fine no evidence of sepsis or systemic infection. He has 94 but has no other risk factors for poor prognosis. Will start on Keflex and have close follow-up with his primary doctor on her for any new or worsening symptoms over the weekend.  Final Clinical Impressions(s) / ED Diagnoses   Final diagnoses:  Peripheral edema  Cellulitis of left lower extremity    New Prescriptions Discharge Medication List as of 02/14/2016 11:16 PM    START taking these medications   Details  cephALEXin (KEFLEX) 250 MG/5ML suspension Take 10 mLs (500 mg total) by mouth 3 (three) times daily., Starting Fri 02/14/2016, Until Fri 03/06/2016, Print         Merrily Pew, MD 02/15/16 0001

## 2016-05-15 ENCOUNTER — Ambulatory Visit (INDEPENDENT_AMBULATORY_CARE_PROVIDER_SITE_OTHER): Payer: Medicare Other | Admitting: Podiatry

## 2016-05-15 DIAGNOSIS — M79676 Pain in unspecified toe(s): Secondary | ICD-10-CM | POA: Diagnosis not present

## 2016-05-15 DIAGNOSIS — B351 Tinea unguium: Secondary | ICD-10-CM

## 2016-05-15 NOTE — Progress Notes (Signed)
This patient presents the office with chief complaint of long thick painful nails. His nails are painful walking and wearing his shoes.  His venous stasis ulcer left leg has healed. He presents for preventive foot care services.   GENERAL APPEARANCE: Alert, conversant. Appropriately groomed. No acute distress.  VASCULAR: Pedal pulses are  diminished at  [facility] and PT bilateral.  Capillary refill time is immediate to all digits,  Cold feet noted.  NEUROLOGIC: sensation is normal to 5.07 monofilament at 5/5 sites bilateral.  Light touch is intact bilateral, Muscle strength normal.  MUSCULOSKELETAL: acceptable muscle strength, tone and stability bilateral.  Intrinsic muscluature intact bilateral.  Rectus appearance of foot and digits noted bilateral. Swelling noted feet B/L.  Inflamed 1st MPJ right foot due to HAV  DERMATOLOGIC: This patient presents with redness noted on the dorsum of his left foot.   Onychomycosis  Venous stasis left leg.  IE  Debridement of nails B/L  RTC 10 weeks.  Gardiner Barefoot DPM

## 2016-08-14 ENCOUNTER — Ambulatory Visit: Payer: Medicare Other | Admitting: Podiatry

## 2016-10-01 ENCOUNTER — Encounter (HOSPITAL_COMMUNITY): Payer: Self-pay | Admitting: Emergency Medicine

## 2016-10-01 DIAGNOSIS — M6282 Rhabdomyolysis: Secondary | ICD-10-CM | POA: Diagnosis not present

## 2016-10-01 DIAGNOSIS — R799 Abnormal finding of blood chemistry, unspecified: Secondary | ICD-10-CM | POA: Diagnosis not present

## 2016-10-01 DIAGNOSIS — K222 Esophageal obstruction: Secondary | ICD-10-CM | POA: Diagnosis not present

## 2016-10-01 DIAGNOSIS — M25551 Pain in right hip: Secondary | ICD-10-CM | POA: Insufficient documentation

## 2016-10-01 DIAGNOSIS — K219 Gastro-esophageal reflux disease without esophagitis: Secondary | ICD-10-CM | POA: Insufficient documentation

## 2016-10-01 DIAGNOSIS — R296 Repeated falls: Secondary | ICD-10-CM | POA: Insufficient documentation

## 2016-10-01 DIAGNOSIS — I998 Other disorder of circulatory system: Principal | ICD-10-CM | POA: Insufficient documentation

## 2016-10-01 DIAGNOSIS — E43 Unspecified severe protein-calorie malnutrition: Secondary | ICD-10-CM | POA: Diagnosis not present

## 2016-10-01 DIAGNOSIS — N179 Acute kidney failure, unspecified: Secondary | ICD-10-CM | POA: Insufficient documentation

## 2016-10-01 DIAGNOSIS — Z85828 Personal history of other malignant neoplasm of skin: Secondary | ICD-10-CM | POA: Diagnosis not present

## 2016-10-01 DIAGNOSIS — R748 Abnormal levels of other serum enzymes: Secondary | ICD-10-CM | POA: Insufficient documentation

## 2016-10-01 DIAGNOSIS — E860 Dehydration: Secondary | ICD-10-CM | POA: Diagnosis not present

## 2016-10-01 DIAGNOSIS — M25552 Pain in left hip: Secondary | ICD-10-CM | POA: Diagnosis not present

## 2016-10-01 DIAGNOSIS — E876 Hypokalemia: Secondary | ICD-10-CM | POA: Insufficient documentation

## 2016-10-01 LAB — URINALYSIS, ROUTINE W REFLEX MICROSCOPIC
BILIRUBIN URINE: NEGATIVE
GLUCOSE, UA: NEGATIVE mg/dL
Ketones, ur: NEGATIVE mg/dL
NITRITE: NEGATIVE
PH: 5 (ref 5.0–8.0)
Protein, ur: 30 mg/dL — AB
SPECIFIC GRAVITY, URINE: 1.014 (ref 1.005–1.030)

## 2016-10-01 LAB — BASIC METABOLIC PANEL
ANION GAP: 11 (ref 5–15)
BUN: 41 mg/dL — AB (ref 6–20)
CALCIUM: 13 mg/dL — AB (ref 8.9–10.3)
CO2: 29 mmol/L (ref 22–32)
Chloride: 102 mmol/L (ref 101–111)
Creatinine, Ser: 1.24 mg/dL (ref 0.61–1.24)
GFR calc Af Amer: 56 mL/min — ABNORMAL LOW (ref >60)
GFR, EST NON AFRICAN AMERICAN: 48 mL/min — AB (ref >60)
Glucose, Bld: 131 mg/dL — ABNORMAL HIGH (ref 65–99)
Potassium: 3.3 mmol/L — ABNORMAL LOW (ref 3.5–5.1)
SODIUM: 142 mmol/L (ref 135–145)

## 2016-10-01 LAB — CBC
HEMATOCRIT: 41.6 % (ref 39.0–52.0)
Hemoglobin: 14.4 g/dL (ref 13.0–17.0)
MCH: 30.7 pg (ref 26.0–34.0)
MCHC: 34.6 g/dL (ref 30.0–36.0)
MCV: 88.7 fL (ref 78.0–100.0)
Platelets: 158 10*3/uL (ref 150–400)
RBC: 4.69 MIL/uL (ref 4.22–5.81)
RDW: 14 % (ref 11.5–15.5)
WBC: 11.9 10*3/uL — AB (ref 4.0–10.5)

## 2016-10-01 LAB — CBG MONITORING, ED: GLUCOSE-CAPILLARY: 130 mg/dL — AB (ref 65–99)

## 2016-10-01 NOTE — ED Triage Notes (Signed)
Pt arrives from home with hx fall today and cellulitis ongoing (currently on antibiotics - unknown name.) Family at bedside poor historians. Reports of weakness since Tuesday. Pt is alertx4. Pt reports bilateral feet pain, reports cellulitis in both.   Hx skin caner, lesions on face scabbed over.

## 2016-10-02 ENCOUNTER — Inpatient Hospital Stay (HOSPITAL_BASED_OUTPATIENT_CLINIC_OR_DEPARTMENT_OTHER): Payer: Medicare Other

## 2016-10-02 ENCOUNTER — Inpatient Hospital Stay (HOSPITAL_COMMUNITY): Payer: Medicare Other

## 2016-10-02 ENCOUNTER — Emergency Department (HOSPITAL_COMMUNITY): Payer: Medicare Other

## 2016-10-02 ENCOUNTER — Encounter (HOSPITAL_COMMUNITY): Payer: Self-pay | Admitting: Internal Medicine

## 2016-10-02 ENCOUNTER — Observation Stay (HOSPITAL_COMMUNITY)
Admission: EM | Admit: 2016-10-02 | Discharge: 2016-10-04 | Disposition: A | Discharge status: Skilled Nursing Facility | Payer: Medicare Other | Attending: Family Medicine | Admitting: Family Medicine

## 2016-10-02 DIAGNOSIS — W19XXXA Unspecified fall, initial encounter: Secondary | ICD-10-CM | POA: Diagnosis not present

## 2016-10-02 DIAGNOSIS — E43 Unspecified severe protein-calorie malnutrition: Secondary | ICD-10-CM | POA: Diagnosis present

## 2016-10-02 DIAGNOSIS — M6282 Rhabdomyolysis: Secondary | ICD-10-CM | POA: Diagnosis not present

## 2016-10-02 DIAGNOSIS — I998 Other disorder of circulatory system: Principal | ICD-10-CM | POA: Diagnosis present

## 2016-10-02 DIAGNOSIS — K219 Gastro-esophageal reflux disease without esophagitis: Secondary | ICD-10-CM | POA: Diagnosis not present

## 2016-10-02 DIAGNOSIS — E876 Hypokalemia: Secondary | ICD-10-CM | POA: Diagnosis present

## 2016-10-02 DIAGNOSIS — N179 Acute kidney failure, unspecified: Secondary | ICD-10-CM | POA: Diagnosis not present

## 2016-10-02 DIAGNOSIS — E860 Dehydration: Secondary | ICD-10-CM | POA: Diagnosis not present

## 2016-10-02 DIAGNOSIS — R748 Abnormal levels of other serum enzymes: Secondary | ICD-10-CM

## 2016-10-02 DIAGNOSIS — R531 Weakness: Secondary | ICD-10-CM

## 2016-10-02 DIAGNOSIS — I7090 Unspecified atherosclerosis: Secondary | ICD-10-CM

## 2016-10-02 DIAGNOSIS — M6281 Muscle weakness (generalized): Secondary | ICD-10-CM | POA: Diagnosis not present

## 2016-10-02 HISTORY — DX: Unspecified malignant neoplasm of skin, unspecified: C44.90

## 2016-10-02 LAB — BASIC METABOLIC PANEL
ANION GAP: 10 (ref 5–15)
BUN: 43 mg/dL — AB (ref 6–20)
CALCIUM: 12.9 mg/dL — AB (ref 8.9–10.3)
CO2: 32 mmol/L (ref 22–32)
CREATININE: 1.23 mg/dL (ref 0.61–1.24)
Chloride: 104 mmol/L (ref 101–111)
GFR calc Af Amer: 56 mL/min — ABNORMAL LOW (ref >60)
GFR, EST NON AFRICAN AMERICAN: 48 mL/min — AB (ref >60)
GLUCOSE: 126 mg/dL — AB (ref 65–99)
Potassium: 3.5 mmol/L (ref 3.5–5.1)
Sodium: 146 mmol/L — ABNORMAL HIGH (ref 135–145)

## 2016-10-02 LAB — CBC
HCT: 39.8 % (ref 39.0–52.0)
HEMOGLOBIN: 13.5 g/dL (ref 13.0–17.0)
MCH: 30.3 pg (ref 26.0–34.0)
MCHC: 33.9 g/dL (ref 30.0–36.0)
MCV: 89.4 fL (ref 78.0–100.0)
PLATELETS: 125 10*3/uL — AB (ref 150–400)
RBC: 4.45 MIL/uL (ref 4.22–5.81)
RDW: 14.1 % (ref 11.5–15.5)
WBC: 11.3 10*3/uL — ABNORMAL HIGH (ref 4.0–10.5)

## 2016-10-02 LAB — DIFFERENTIAL
BASOS PCT: 0 %
Basophils Absolute: 0 10*3/uL (ref 0.0–0.1)
EOS PCT: 0 %
Eosinophils Absolute: 0 10*3/uL (ref 0.0–0.7)
LYMPHS PCT: 5 %
Lymphs Abs: 0.6 10*3/uL — ABNORMAL LOW (ref 0.7–4.0)
MONO ABS: 0.7 10*3/uL (ref 0.1–1.0)
MONOS PCT: 6 %
Neutro Abs: 10.3 10*3/uL — ABNORMAL HIGH (ref 1.7–7.7)
Neutrophils Relative %: 89 %

## 2016-10-02 LAB — PROTIME-INR
INR: 1.04
PROTHROMBIN TIME: 13.7 s (ref 11.4–15.2)

## 2016-10-02 LAB — ABO/RH: ABO/RH(D): O POS

## 2016-10-02 LAB — CK
CK TOTAL: 882 U/L — AB (ref 49–397)
CK TOTAL: 536 U/L — AB (ref 49–397)

## 2016-10-02 LAB — I-STAT CG4 LACTIC ACID, ED: LACTIC ACID, VENOUS: 1.85 mmol/L (ref 0.5–1.9)

## 2016-10-02 LAB — TYPE AND SCREEN
ABO/RH(D): O POS
Antibody Screen: NEGATIVE

## 2016-10-02 LAB — GLUCOSE, CAPILLARY: Glucose-Capillary: 106 mg/dL — ABNORMAL HIGH (ref 65–99)

## 2016-10-02 LAB — APTT: aPTT: 25 seconds (ref 24–36)

## 2016-10-02 MED ORDER — ZOLPIDEM TARTRATE 5 MG PO TABS: 5.0000 mg | ORAL_TABLET | Freq: Every evening | ORAL | Status: DC | PRN

## 2016-10-02 MED ORDER — ENSURE ENLIVE PO LIQD
237.0000 mL | Freq: Two times a day (BID) | ORAL | Status: DC
  Administered 2016-10-02: 237 mL via ORAL

## 2016-10-02 MED ORDER — ACETAMINOPHEN 325 MG PO TABS
650.0000 mg | ORAL_TABLET | Freq: Four times a day (QID) | ORAL | Status: DC | PRN
  Administered 2016-10-03: 650 mg via ORAL
  Filled 2016-10-02: qty 2

## 2016-10-02 MED ORDER — HEPARIN SODIUM (PORCINE) 5000 UNIT/ML IJ SOLN
5000.0000 [IU] | Freq: Three times a day (TID) | INTRAMUSCULAR | Status: DC
  Administered 2016-10-02 – 2016-10-04 (×7): 5000 [IU] via SUBCUTANEOUS
  Filled 2016-10-02 (×7): qty 1

## 2016-10-02 MED ORDER — HYDROCODONE-ACETAMINOPHEN 5-325 MG PO TABS: 1.0000 | ORAL_TABLET | Freq: Four times a day (QID) | ORAL | Status: DC | PRN

## 2016-10-02 MED ORDER — SODIUM CHLORIDE 0.9% FLUSH
3.0000 mL | Freq: Two times a day (BID) | INTRAVENOUS | Status: DC
  Administered 2016-10-02 – 2016-10-03 (×2): 3 mL via INTRAVENOUS

## 2016-10-02 MED ORDER — ENSURE ENLIVE PO LIQD
237.0000 mL | Freq: Three times a day (TID) | ORAL | Status: DC
  Administered 2016-10-02 – 2016-10-04 (×5): 237 mL via ORAL

## 2016-10-02 MED ORDER — ONDANSETRON HCL 4 MG PO TABS: 4.0000 mg | ORAL_TABLET | Freq: Four times a day (QID) | ORAL | Status: DC | PRN

## 2016-10-02 MED ORDER — ONDANSETRON HCL 4 MG/2ML IJ SOLN: 4.0000 mg | Freq: Four times a day (QID) | INTRAMUSCULAR | Status: DC | PRN

## 2016-10-02 MED ORDER — ACETAMINOPHEN 650 MG RE SUPP: 650.0000 mg | Freq: Four times a day (QID) | RECTAL | Status: DC | PRN

## 2016-10-02 MED ORDER — POTASSIUM CHLORIDE 20 MEQ/15ML (10%) PO SOLN
20.0000 meq | Freq: Once | ORAL | Status: AC
  Administered 2016-10-02: 20 meq via ORAL
  Filled 2016-10-02: qty 15

## 2016-10-02 MED ORDER — ASPIRIN 81 MG PO CHEW
324.0000 mg | CHEWABLE_TABLET | Freq: Every day | ORAL | Status: DC
  Administered 2016-10-02 – 2016-10-04 (×3): 324 mg via ORAL
  Filled 2016-10-02 (×3): qty 4

## 2016-10-02 MED ORDER — PANTOPRAZOLE SODIUM 40 MG PO TBEC
40.0000 mg | DELAYED_RELEASE_TABLET | Freq: Every day | ORAL | Status: DC
Start: 2016-10-02 — End: 2016-10-04
  Administered 2016-10-02 – 2016-10-03 (×2): 40 mg via ORAL
  Filled 2016-10-02 (×2): qty 1

## 2016-10-02 MED ORDER — SODIUM CHLORIDE 0.9 % IV BOLUS (SEPSIS)
500.0000 mL | Freq: Once | INTRAVENOUS | Status: AC
  Administered 2016-10-02: 500 mL via INTRAVENOUS

## 2016-10-02 MED ORDER — SODIUM CHLORIDE 0.9 % IV SOLN
INTRAVENOUS | Status: DC
  Administered 2016-10-02 – 2016-10-03 (×3): via INTRAVENOUS
  Administered 2016-10-04: 1000 mL via INTRAVENOUS

## 2016-10-02 NOTE — H&P (Signed)
History and Physical    Randy Rojas TIR:[id number] DOB: [birth year] DOA: 10/02/2016  Referring MD/NP/PA:   PCP: Kandice Hams, MD   Patient coming from:  The patient is coming from home.  At baseline, pt is independent for most of ADL.   Chief Complaint: Cold legs, leg weakness, and multiple fall  HPI: Randy Rojas is a 83 y.o. male with medical history significant of skin cancer, multiple skin lesion in face, glaucoma, esophageal stricture (s/p of dilation), who presents with cold legs, leg weakness and multiple fall.  Per pt's niece, pt is usually very active and lives alone. Does not take any medications except for nutrition supplement. Since Tuesday, patient started feeling weak and cold in legs. He denies any tenderness in legs. No loss of sensation in legs. Denies claudication. He had multiple falls due to weakness. He probably injured his head. He complains bilateral mild hip pain. Patient was seen by his PCP on 08/2015, and diagnosed him as cellulitis in legs, and started him with Augmentin. Patient has been taking the antibiotics intermittently without help. Patient states that he has heart burning sometimes, but no nausea, vomiting, diarrhea or abdominal pain. No symptoms of UTI. Denies any chest pain, SOB, cough, fever or chills. He had another fall and was found to be on the floor by his niece today. He was brought to ED tonight for evaluation. He has been losing weight for several weeks.   ED Course: pt was found to have WBC 11.9, lactate of 1.85, urinalysis with trace amount of leukocyte, mild acute renal injury with creatinine 1.24, potassium 3.3, calcium 13, calcium 13.0, temperature normal, oxygen saturation 100% on room air. X-ray of left foot and left tibia/fibular negative. His pulses of DP/PT are not palpable bilaterally. Patient is admitted to telemetry bed as inpatient. VVS, dr. Donzetta Matters was consulted.  Review of Systems:   General: no fevers, chills, has body weight  loss, has poor appetite, has fatigue HEENT: no blurry vision, hearing changes or sore throat Respiratory: no dyspnea, coughing, wheezing CV: no chest pain, no palpitations GI: no nausea, vomiting, abdominal pain, diarrhea, constipation GU: no dysuria, burning on urination, increased urinary frequency, hematuria  Ext: has leg weakness and feeling cold in legs Neuro: no unilateral weakness, numbness, or tingling, no vision change or hearing loss Skin: has multiple skin lesions in face MSK: No muscle spasm, no deformity, no limitation of range of movement in spin Heme: No easy bruising.  Travel history: No recent long distant travel.  Allergy: No Known Allergies  Past Medical History:  Diagnosis Date  . Glaucoma   . Skin cancer     Past Surgical History:  Procedure Laterality Date  . ESOPHAGEAL DILATION      Social History:  reports that he has never smoked. He has never used smokeless tobacco. He reports that he does not drink alcohol. His drug history is not on file.  Family History:  Family History  Problem Relation Age of Onset  . Heart attack Brother      Prior to Admission medications   Medication Sig Start Date End Date Taking? Authorizing Provider  amoxicillin-clavulanate (AUGMENTIN) 400-57 MG/5ML suspension Take 480 mg by mouth daily.  09/23/16  Yes Historical Provider, MD    Physical Exam: Vitals:   10/02/16 0237 10/02/16 0300 10/02/16 0400 10/02/16 0410  BP: 130/72 (!) 111/52 111/60   Pulse: 81 78 73   Resp: 14 13 13    Temp:    97.7 F (36.5  C)  TempSrc:      SpO2: 100% 99% 99%    General: Not in acute distress. Cachectic looking. HEENT:       Eyes: PERRL, EOMI, no scleral icterus.       ENT: No discharge from the ears and nose, no pharynx injection, no tonsillar enlargement.        Neck: No JVD, no bruit, no mass felt. Heme: No neck lymph node enlargement. Cardiac: S1/S2, RRR, No murmurs, No gallops or rubs. Respiratory:  No rales, wheezing, rhonchi or  rubs. GI: Soft, nondistended, nontender, no rebound pain, no organomegaly, BS present. GU: No hematuria Ext: No pitting leg edema bilaterally. DP/PT pulses are not palpable bilaterally. PT pulses are detectable in both using portable doppler. Musculoskeletal: No joint deformities, No joint redness or warmth, no limitation of ROM in spin. Skin: Has multiple skin lesions in face. Neuro: Alert, oriented X3, cranial nerves II-XII grossly intact, moves all extremities normally. Marland Kitchen Psych: Patient is not psychotic, no suicidal or hemocidal ideation.  Labs on Admission: I have personally reviewed following labs and imaging studies  CBC:  Recent Labs Lab 10/01/16 2253 10/02/16 0139 10/02/16 0423  WBC 11.9*  --  11.3*  NEUTROABS  --  10.3*  --   HGB 14.4  --  13.5  HCT 41.6  --  39.8  MCV 88.7  --  89.4  PLT 158  --  195*   Basic Metabolic Panel:  Recent Labs Lab 10/01/16 2253 10/02/16 0423  NA 142 146*  K 3.3* 3.5  CL 102 104  CO2 29 32  GLUCOSE 131* 126*  BUN 41* 43*  CREATININE 1.24 1.23  CALCIUM 13.0* 12.9*   GFR: CrCl cannot be calculated (Unknown ideal weight.). Liver Function Tests: No results for input(s): AST, ALT, ALKPHOS, BILITOT, PROT, ALBUMIN in the last 168 hours. No results for input(s): LIPASE, AMYLASE in the last 168 hours. No results for input(s): AMMONIA in the last 168 hours. Coagulation Profile:  Recent Labs Lab 10/02/16 0423  INR 1.04   Cardiac Enzymes:  Recent Labs Lab 10/01/16 2308 10/02/16 0423  CKTOTAL 882* 536*   BNP (last 3 results) No results for input(s): PROBNP in the last 8760 hours. HbA1C: No results for input(s): HGBA1C in the last 72 hours. CBG:  Recent Labs Lab 10/01/16 2249  GLUCAP 130*   Lipid Profile: No results for input(s): CHOL, HDL, LDLCALC, TRIG, CHOLHDL, LDLDIRECT in the last 72 hours. Thyroid Function Tests: No results for input(s): TSH, T4TOTAL, FREET4, T3FREE, THYROIDAB in the last 72 hours. Anemia  Panel: No results for input(s): VITAMINB12, FOLATE, FERRITIN, TIBC, IRON, RETICCTPCT in the last 72 hours. Urine analysis:    Component Value Date/Time   COLORURINE AMBER (A) 10/01/2016 2311   APPEARANCEUR CLOUDY (A) 10/01/2016 2311   LABSPEC 1.014 10/01/2016 2311   PHURINE 5.0 10/01/2016 2311   GLUCOSEU NEGATIVE 10/01/2016 2311   HGBUR LARGE (A) 10/01/2016 2311   BILIRUBINUR NEGATIVE 10/01/2016 2311   KETONESUR NEGATIVE 10/01/2016 2311   PROTEINUR 30 (A) 10/01/2016 2311   NITRITE NEGATIVE 10/01/2016 2311   LEUKOCYTESUR TRACE (A) 10/01/2016 2311   Sepsis Labs: @LABRCNTIP (procalcitonin:4,lacticidven:4) )No results found for this or any previous visit (from the past 240 hour(s)).   Radiological Exams on Admission: Dg Tibia/fibula Left  Result Date: 10/02/2016 CLINICAL DATA:  [place] at home today.  Swelling and erythema. EXAM: LEFT TIBIA AND FIBULA - 2 VIEW COMPARISON:  None. FINDINGS: There is no evidence of fracture or other focal bone  lesions. Soft tissues are unremarkable. IMPRESSION: Negative. Electronically Signed   By: Andreas Newport M.D.   On: 10/02/2016 02:42   Ct Head Wo Contrast  Result Date: 10/02/2016 CLINICAL DATA:  [place] today. EXAM: CT HEAD WITHOUT CONTRAST CT CERVICAL SPINE WITHOUT CONTRAST TECHNIQUE: Multidetector CT imaging of the head and cervical spine was performed following the standard protocol without intravenous contrast. Multiplanar CT image reconstructions of the cervical spine were also generated. COMPARISON:  None. FINDINGS: CT HEAD FINDINGS Brain: There is no intracranial hemorrhage, mass or evidence of acute infarction. There is moderate generalized atrophy. There is mild chronic microvascular ischemic change. There is no significant extra-axial fluid collection. No acute intracranial findings are evident. Vascular: No hyperdense vessel or unexpected calcification. Skull: Normal. Negative for fracture or focal lesion. Sinuses/Orbits: No acute finding. Other:  None. CT CERVICAL SPINE FINDINGS Alignment: Normal. Skull base and vertebrae: No acute fracture. No primary bone lesion or focal pathologic process. Soft tissues and spinal canal: No prevertebral fluid or swelling. No visible canal hematoma. Disc levels: Moderate cervical degenerative disc disease. Moderate facet arthritis. Upper chest: Negative. Other: None IMPRESSION: 1. No acute intracranial findings. There is moderate generalized atrophy and chronic appearing white matter hypodensities which likely represent small vessel ischemic disease. 2. Negative for acute cervical spine fracture. Electronically Signed   By: Andreas Newport M.D.   On: 10/02/2016 05:18   Ct Cervical Spine Wo Contrast  Result Date: 10/02/2016 CLINICAL DATA:  [place] today. EXAM: CT HEAD WITHOUT CONTRAST CT CERVICAL SPINE WITHOUT CONTRAST TECHNIQUE: Multidetector CT imaging of the head and cervical spine was performed following the standard protocol without intravenous contrast. Multiplanar CT image reconstructions of the cervical spine were also generated. COMPARISON:  None. FINDINGS: CT HEAD FINDINGS Brain: There is no intracranial hemorrhage, mass or evidence of acute infarction. There is moderate generalized atrophy. There is mild chronic microvascular ischemic change. There is no significant extra-axial fluid collection. No acute intracranial findings are evident. Vascular: No hyperdense vessel or unexpected calcification. Skull: Normal. Negative for fracture or focal lesion. Sinuses/Orbits: No acute finding. Other: None. CT CERVICAL SPINE FINDINGS Alignment: Normal. Skull base and vertebrae: No acute fracture. No primary bone lesion or focal pathologic process. Soft tissues and spinal canal: No prevertebral fluid or swelling. No visible canal hematoma. Disc levels: Moderate cervical degenerative disc disease. Moderate facet arthritis. Upper chest: Negative. Other: None IMPRESSION: 1. No acute intracranial findings. There is moderate  generalized atrophy and chronic appearing white matter hypodensities which likely represent small vessel ischemic disease. 2. Negative for acute cervical spine fracture. Electronically Signed   By: Andreas Newport M.D.   On: 10/02/2016 05:18   Dg Foot Complete Left  Result Date: 10/02/2016 CLINICAL DATA:  [place] at home today.  Swelling and erythema. EXAM: LEFT FOOT - COMPLETE 3+ VIEW COMPARISON:  None. FINDINGS: There is no evidence of fracture or dislocation. There is no evidence of arthropathy or other focal bone abnormality. Soft tissues are unremarkable. IMPRESSION: Negative. Electronically Signed   By: Andreas Newport M.D.   On: 10/02/2016 02:42   Dg Hips Bilat With Pelvis 2v  Result Date: 10/02/2016 CLINICAL DATA:  Pain after fall tonight EXAM: DG HIP (WITH OR WITHOUT PELVIS) 2V BILAT COMPARISON:  None. FINDINGS: There is no evidence of hip fracture or dislocation. There is no evidence of arthropathy or other focal bone abnormality. IMPRESSION: Negative. Electronically Signed   By: Andreas Newport M.D.   On: 10/02/2016 05:26     EKG: Independently reviewed.  Sinus rhythm, QTC 507, PA-C, nonspecific T-wave change.  Assessment/Plan Principal Problem:   Ischemic leg Active Problems:   Fall   Protein-calorie malnutrition, severe (HCC)   GERD (gastroesophageal reflux disease)   Dehydration   AKI (acute kidney injury) (HCC)   Rhabdomyolysis   Hypokalemia   Hypercalcemia   Ischemic legs: DP/PT pulses are not palpable bilaterally. PT pulses are detectable in both using portable doppler. VVS, Dr. Donzetta Matters evaluated pt-->not need any urgent vascular intervention. Dr. Donzetta Matters recommended to get ABI and start chewable aspirin (He cannot swallow medications due to chronic esophageal stricture). Patient has mild leukocytosis, but no fever. Patient does not have any tenderness in legs, does not seem to have cellulitis. We'll hold antibiotics.  -will admit to tele bed as inpt. -highly appreciate Dr.  Claretha Cooper recommendations -get ABI -start chewable aspirin   Fall: due to weakness in legs. Complains of bilateral mild hip pain. -CT-head and CT C-spine -X-ray of pelvis with bilateral hips -prn Norco and tylenol  GERD: -Protonix  Protein-calorie malnutrition, severe -consult to Nutrition  Rhabdomyolysis, AKI and dehydration: CK 882. AKI with Cre 1.24 -IVF: 500 ml of NS bolus in ED, then 125 cc/h -repeat CK in AM -f/u renal Fx by BMP  Hypokalemia: K= 3.3 on admission. - Repleted  Hypercalcemia: Ca 13. Most likely due to dehydration. -IV fluid as above -If not improved with IV fluid, may need to look for malignancy   DVT ppx: SQ Heparin   Code Status: Full code Family Communication: Yes, patient's niece at bed side Disposition Plan:  Anticipate discharge back to previous home environment Consults called:VVS, Dr. Donzetta Matters   Admission status: Inpatient/tele     Date of Service 10/02/2016    Ivor Costa [facility] Pager [phone]  If 7PM-7AM, please contact night-coverage [email or web address] Password [email or web address] 10/02/2016, 5:31 AM

## 2016-10-02 NOTE — Consult Note (Signed)
Hospital Consult    Reason for Consult:  Leg weakness Referring Physician:  ED MRN #:  [id number]  History of Present Illness: This is a 83 y.o. male without significant pmh presents with several days of weakness and falls. Most history is obtained via niece at bedside. He is usually very active and lives alone. Does not take any medications. For at least one week has been treated for cellulitis of his feet with an antibiotic that he takes intermittently. He currently denies fevers or chills. He does not have any pain in his feet at present. Denies claudication. Per niece he has occasional swelling in his bilateral feet and ankles that causes pain. He was brought to [facility] [place] tonight for persistent falling. He has been losing weight for several weeks. Is non-diabetic and never smoker. He is retired Administrator.  Past Medical History:  Diagnosis Date  . Glaucoma     History reviewed. No pertinent surgical history.  No Known Allergies  Prior to Admission medications   Medication Sig Start Date End Date Taking? Authorizing Provider  amoxicillin-clavulanate (AUGMENTIN) 400-57 MG/5ML suspension Take 480 mg by mouth daily.  09/23/16  Yes Historical Provider, MD    Social History   Social History  . Marital status: Widowed    Spouse name: N/A  . Number of children: N/A  . Years of education: N/A   Occupational History  . Not on file.   Social History Main Topics  . Smoking status: Never Smoker  . Smokeless tobacco: Never Used  . Alcohol use No  . Drug use: Unknown  . Sexual activity: Not on file   Other Topics Concern  . Not on file   Social History Narrative  . No narrative on file     History reviewed. No pertinent family history.  ROS:  Cardiovascular: []  chest pain/pressure []  palpitations []  SOB lying flat []  DOE []  pain in legs while walking []  pain in legs at rest []  pain in legs at night []  non-healing ulcers []  hx of DVT [x]  swelling in  legs  Pulmonary: []  productive cough []  asthma/wheezing []  home O2  Neurologic: [x]  weakness in []  arms [x]  legs []  numbness in []  arms []  legs []  hx of CVA []  mini stroke [] difficulty speaking or slurred speech []  temporary loss of vision in one eye []  dizziness  Hematologic: [x]  hx of cancer (skin cancer) []  bleeding problems []  problems with blood clotting easily  Endocrine:   []  diabetes []  thyroid disease  GI []  vomiting blood []  blood in stool  GU: []  CKD/renal failure []  HD--[]  M/W/F or []  T/T/S []  burning with urination []  blood in urine  Psychiatric: []  anxiety []  depression  Musculoskeletal: []  arthritis [x]  leg pain  Integumentary: []  rashes [x]  skin cancer  Constitutional: []  fever []  chills   Physical Examination  Vitals:   10/02/16 0237 10/02/16 0300  BP: 130/72 (!) 111/52  Pulse: 81 78  Resp: 14 13  Temp:     There is no height or weight on file to calculate BMI.  General:  Somnolent but in NAD Gait: Not observed HENT: mulipte abrasions to face  Pulmonary: normal non-labored breathing Cardiac: palpable radial, femoral, popliteal pulses bilaterally with regular rate Right strong biphasic pt and monophasic peroneal signals Monophasic left pt Abdomen: soft, nt, no masses Extremities: left foot is cooler than right, there are no ulcerations, no cellulitis Musculoskeletal:he is cachectic  Neurologic: sensation and motor in tact of both feet Psychiatric:  Normal affect, answers questions appropriately   CBC    Component Value Date/Time   WBC 11.9 (H) 10/01/2016 2253   RBC 4.69 10/01/2016 2253   HGB 14.4 10/01/2016 2253   HCT 41.6 10/01/2016 2253   PLT 158 10/01/2016 2253   MCV 88.7 10/01/2016 2253   MCH 30.7 10/01/2016 2253   MCHC 34.6 10/01/2016 2253   RDW 14.0 10/01/2016 2253   LYMPHSABS 0.6 (L) 10/02/2016 0139   MONOABS 0.7 10/02/2016 0139   EOSABS 0.0 10/02/2016 0139   BASOSABS 0.0 10/02/2016 0139    BMET     Component Value Date/Time   NA 142 10/01/2016 2253   K 3.3 (L) 10/01/2016 2253   CL 102 10/01/2016 2253   CO2 29 10/01/2016 2253   GLUCOSE 131 (H) 10/01/2016 2253   BUN 41 (H) 10/01/2016 2253   CREATININE 1.24 10/01/2016 2253   CALCIUM 13.0 (H) 10/01/2016 2253   GFRNONAA 48 (L) 10/01/2016 2253   GFRAA 56 (L) 10/01/2016 2253    COAGS: No results found for: INR, PROTIME    ASSESSMENT/PLAN: This is a 83 y.o. male here for frequent falls and weakness with recent history of cellulitis of his bilateral lower extremities that appears to have resolved. He has a strong signal at Right PT and monophasic left PT. His feet have sensation and motor in tact. Getting admitted by medicine and should get chewable aspirin and he reportedly cannot swallow medications. Also needs ABI to document level of chronic arterial insufficiency. He does not need any urgent vascular intervention and possibly will not require intervention at all given lack of rest pain or tissue loss and overall decompensated state. Will f/u abi.  Brandon C. Donzetta Matters, MD [facility] Office: [phone] Pager: [phone]

## 2016-10-02 NOTE — ED Notes (Signed)
Patient transported to CT 

## 2016-10-02 NOTE — Progress Notes (Signed)
Patient arrived to floor from ED. Alert to name and birthdate and place.  Patient verified and placed on telemetry. Reports living alone.  Has visible scratch marks on face, unshaven and cachectic. Was informed he fell at home, where patient states, he lives alone. Bed alarm on Safety maintained.

## 2016-10-02 NOTE — Care Management CC44 (Signed)
Condition Code 44 Documentation Completed  Patient Details  Name: Randy Rojas MRN: [id number] Date of Birth: 1919/12/09   Condition Code 44 given:  Yes Patient signature on Condition Code 44 notice:   (could obtain signature, patient verbalized understanding) Documentation of 2 MD's agreement:  Yes Code 44 added to claim:  Yes    Carles Collet, RN 10/02/2016, 5:45 PM

## 2016-10-02 NOTE — Progress Notes (Signed)
VASCULAR LAB PRELIMINARY  ARTERIAL  ABI completed: Unable to obtain bilateral ABI's due to non-compressible arteries likely due to medial calcification. Right TBI of 0.31 and left TBI of 0.24 are suggestive of abnormal arterial flow at rest.   RIGHT    LEFT    PRESSURE WAVEFORM  PRESSURE WAVEFORM  BRACHIAL 123 Triphasic BRACHIAL 125 Triphasic  DP >254 Monophasic DP >254 Monophasic  PT >254 Monophasic PT >254 Monophasic  GREAT TOE 39 NA GREAT TOE 30 NA    RIGHT LEFT  ABI       Legrand Como, RVT 10/02/2016, 1:08 PM

## 2016-10-02 NOTE — Progress Notes (Signed)
Admission note dated 10/02/16 at 2130 should have been entered at 10/02/16 at [place]

## 2016-10-02 NOTE — Progress Notes (Signed)
Initial Nutrition Assessment  DOCUMENTATION CODES:   Not applicable  INTERVENTION:   -Ensure Enlive po TID, each supplement provides 350 kcal and 20 grams of protein  NUTRITION DIAGNOSIS:   Inadequate oral intake related to poor appetite as evidenced by meal completion < 25%.  GOAL:   Patient will meet greater than or equal to 90% of their needs  MONITOR:   PO intake, Supplement acceptance, Labs, Weight trends, Skin, I & O's  REASON FOR ASSESSMENT:   Consult Assessment of nutrition requirement/status  ASSESSMENT:   Randy Rojas is a 83 y.o. male with medical history significant of skin cancer, multiple skin lesion in face, glaucoma, esophageal stricture (s/p of dilation), who presents with cold legs, leg weakness and multiple fall.  Pt admitted with ischemic legs.   Pt not in room at times of visits. No family present to provide additional hx.  Unable to complete Nutrition-Focused physical exam at this time.   Per H&P, pt family is concerned regarding weight loss and poor oral intake. Per MD notes, pt was consuming nutritional supplement at home, however, unsure of which supplement this was.   Appetite is very poor. Per meal completion records, PO 10-25%.   No recent wt hx available to assess.   Pt at high risk for malnutrition given potential wt loss, poor oral intake, and advanced age. However, unable to identify at this time given limited information. RD will order supplement to optimize intake.   Labs reviewed: CBGS: 106.   Diet Order:  DIET DYS 2 Room service appropriate? Yes; Fluid consistency: Thin  Skin:  Reviewed, no issues  Last BM:  PTA  Height:   Ht Readings from Last 1 Encounters:  02/14/16 5\' 6"  (1.676 m)    Weight:   Wt Readings from Last 1 Encounters:  02/14/16 120 lb (54.4 kg)    Ideal Body Weight:  64.5 kg  BMI:  Estimated body mass index is 19.37 kg/m as calculated from the following:   Height as of 02/14/16: 5\' 6"  (1.676 m).  Weight as of 02/14/16: 120 lb (54.4 kg).  Estimated Nutritional Needs:   Kcal:  1400-1600  Protein:  55-70 grams  Fluid:  1.4-1.6 L  EDUCATION NEEDS:   Education needs no appropriate at this time  [name] A. Jimmye Norman, RD, LDN, CDE Pager: [phone] After hours Pager: [phone]

## 2016-10-02 NOTE — ED Provider Notes (Signed)
[facility] DEPT Provider Note   CSN: [id number] Arrival date & time: 10/01/16  2148  By signing my name below, I, Randy Rojas., attest that this documentation has been prepared under the direction and in the presence of Orpah Greek, MD. Electronically signed: Jaquelyn Rojas., ED Scribe. 10/02/16. 3:17 AM.    History   Chief Complaint Chief Complaint  Patient presents with  . Weakness    HPI Randy Rojas is a 83 y.o. male who presents to the Emergency Department complaining of weakness with onset x3 days. Per relative, pt has been weak for the past x3 days since she found him on the floor. She states that pt has had cellulitis in bilateral lower extremities for the past x1 week. At this time pt was administered antibiotics. Relative states that pt has not had the full dose of his antibiotics. She denies any modifying factors. Pt denies any other complaints. Of note, niece states that pt has had bilateral foot pain for the past x1 week.   The history is provided by the patient and a relative. No language interpreter was used.  Weakness     Past Medical History:  Diagnosis Date  . Glaucoma     There are no active problems to display for this patient.   History reviewed. No pertinent surgical history.     Home Medications    Prior to Admission medications   Medication Sig Start Date End Date Taking? Authorizing Provider  amoxicillin-clavulanate (AUGMENTIN) 400-57 MG/5ML suspension Take 480 mg by mouth daily.  09/23/16  Yes Historical Provider, MD    Family History History reviewed. No pertinent family history.  Social History Social History  Substance Use Topics  . Smoking status: Never Smoker  . Smokeless tobacco: Never Used  . Alcohol use No     Allergies   Patient has no known allergies.   Review of Systems Review of Systems  Musculoskeletal: Positive for arthralgias (bilateral feet).  Neurological: Positive for  weakness.  All other systems reviewed and are negative.    Physical Exam Updated Vital Signs BP 130/72 (BP Location: Right Arm)   Pulse 81   Temp 97.7 F (36.5 C) (Oral)   Resp 14   SpO2 100%   Physical Exam  Constitutional: He is oriented to person, place, and time. No distress.  Thin, cachectic  HENT:  Head: Normocephalic and atraumatic.  Right Ear: Hearing normal.  Left Ear: Hearing normal.  Nose: Nose normal.  Mouth/Throat: Oropharynx is clear and moist and mucous membranes are normal.  Eyes: Conjunctivae and EOM are normal. Pupils are equal, round, and reactive to light.  Neck: Normal range of motion. Neck supple.  Cardiovascular: Regular rhythm, S1 normal and S2 normal.  Exam reveals no gallop and no friction rub.   No murmur heard. Pulmonary/Chest: Effort normal and breath sounds normal. No respiratory distress. He exhibits no tenderness.  Abdominal: Soft. Normal appearance and bowel sounds are normal. There is no hepatosplenomegaly. There is no tenderness. There is no rebound, no guarding, no tenderness at McBurney's point and negative Murphy's sign. No hernia.  Musculoskeletal: Normal range of motion.  Diffuse tenderness left foot. Cold to touch. Dusky. No pulses palpable.  Right DP pulse absent Right PT pulse faintly palpable  Left DP pulse absent Left PT puse faintly present with doppler  Neurological: He is alert and oriented to person, place, and time. He has normal strength. No cranial nerve deficit or sensory deficit. Coordination normal. GCS  eye subscore is 4. GCS verbal subscore is 5. GCS motor subscore is 6.  Skin: Skin is intact. No cyanosis.  Significant scabbing and crusting of scalp, forehead, ears and face   Duskiness of left lower leg and foot, mid tibia down to toes  Psychiatric: He has a normal mood and affect. His speech is normal and behavior is normal. Thought content normal.  Nursing note and vitals reviewed.    ED Treatments / Results    DIAGNOSTIC STUDIES: Oxygen Saturation is 97% on RA, adequate by my interpretation.   COORDINATION OF CARE: 3:17 AM-Discussed next steps with pt. Pt verbalized understanding and is agreeable with the plan.    Labs (all labs ordered are listed, but only abnormal results are displayed) Labs Reviewed  BASIC METABOLIC PANEL - Abnormal; Notable for the following:       Result Value   Potassium 3.3 (*)    Glucose, Bld 131 (*)    BUN 41 (*)    Calcium 13.0 (*)    GFR calc non Af Amer 48 (*)    GFR calc Af Amer 56 (*)    All other components within normal limits  CBC - Abnormal; Notable for the following:    WBC 11.9 (*)    All other components within normal limits  URINALYSIS, ROUTINE W REFLEX MICROSCOPIC - Abnormal; Notable for the following:    Color, Urine AMBER (*)    APPearance CLOUDY (*)    Hgb urine dipstick LARGE (*)    Protein, ur 30 (*)    Leukocytes, UA TRACE (*)    Bacteria, UA RARE (*)    Squamous Epithelial / LPF 0-5 (*)    All other components within normal limits  CK - Abnormal; Notable for the following:    Total CK 882 (*)    All other components within normal limits  DIFFERENTIAL - Abnormal; Notable for the following:    Neutro Abs 10.3 (*)    Lymphs Abs 0.6 (*)    All other components within normal limits  CBG MONITORING, ED - Abnormal; Notable for the following:    Glucose-Capillary 130 (*)    All other components within normal limits  I-STAT CG4 LACTIC ACID, ED    EKG  EKG Interpretation  Date/Time:  Thursday October 01 2016 22:44:37 EDT Ventricular Rate:  105 PR Interval:  154 QRS Duration: 88 QT Interval:  384 QTC Calculation: 507 R Axis:   78 Text Interpretation:  Sinus tachycardia with Premature supraventricular complexes Nonspecific T wave abnormality Abnormal ECG Confirmed by Betsey Holiday  MD, CHRISTOPHER [phone]) on 10/02/2016 12:52:07 AM       Radiology Dg Tibia/fibula Left  Result Date: 10/02/2016 CLINICAL DATA:  [place] at home today.   Swelling and erythema. EXAM: LEFT TIBIA AND FIBULA - 2 VIEW COMPARISON:  None. FINDINGS: There is no evidence of fracture or other focal bone lesions. Soft tissues are unremarkable. IMPRESSION: Negative. Electronically Signed   By: Andreas Newport M.D.   On: 10/02/2016 02:42   Dg Foot Complete Left  Result Date: 10/02/2016 CLINICAL DATA:  [place] at home today.  Swelling and erythema. EXAM: LEFT FOOT - COMPLETE 3+ VIEW COMPARISON:  None. FINDINGS: There is no evidence of fracture or dislocation. There is no evidence of arthropathy or other focal bone abnormality. Soft tissues are unremarkable. IMPRESSION: Negative. Electronically Signed   By: Andreas Newport M.D.   On: 10/02/2016 02:42    Procedures Procedures (including critical care time)  Medications Ordered in  ED Medications  sodium chloride 0.9 % bolus 500 mL (500 mLs Intravenous New Bag/Given 10/02/16 0219)     Initial Impression / Assessment and Plan / ED Course  I have reviewed the triage vital signs and the nursing notes.  Pertinent labs & imaging results that were available during my care of the patient were reviewed by me and considered in my medical decision making (see chart for details).     Patient presented to the ER with complaints of bilateral foot pain and generalized weakness. Patient reportedly had been doing well until several days ago. Family reports that he was even driving earlier in the week, then became extremely weak when he started to have redness of his feet. He was started on antibiotic by his primary care physician for cellulitis. Examination reveals that his left foot is cold to the touch. I am unable to find dorsalis pedal pulses on either foot. He has a very faint posterior tibialis on the left, normal on the right. Current examination is consistent with ischemic foot. He has a normal lactic acid but total CPK is elevated above 800. Dr. Donzetta Matters, vascular surgery has been consulted, will see the patient.  Patient  extremely weak on examination. He is thin and cachectic. Examination is consistent with severe dehydration. BUN elevated at 41, consistent with prerenal azotemia. Patient initiated on IV fluid resuscitation.  Final Clinical Impressions(s) / ED Diagnoses   Final diagnoses:  Generalized weakness  Dehydration  Arterial occlusion ([place])   CRITICAL CARE Performed by: Orpah Greek.   Total critical care time: 30 minutes  Critical care time was exclusive of separately billable procedures and treating other patients.  Critical care was necessary to treat or prevent imminent or life-threatening deterioration.  Critical care was time spent personally by me on the following activities: development of treatment plan with patient and/or surrogate as well as nursing, discussions with consultants, evaluation of patient's response to treatment, examination of patient, obtaining history from patient or surrogate, ordering and performing treatments and interventions, ordering and review of laboratory studies, ordering and review of radiographic studies, pulse oximetry and re-evaluation of patient's condition.   New Prescriptions New Prescriptions   No medications on file   I personally performed the services described in this documentation, which was scribed in my presence. The recorded information has been reviewed and is accurate.     Orpah Greek, MD 10/02/16 [phone]

## 2016-10-02 NOTE — Care Management Obs Status (Signed)
[facility] NOTIFICATION   Patient Details  Name: Randy Rojas MRN: [id number] Date of Birth: [birth year]   Medicare Observation Status Notification Given:  Yes    Carles Collet, RN 10/02/2016, 5:43 PM

## 2016-10-02 NOTE — ED Notes (Signed)
Attempted report x1. 

## 2016-10-02 NOTE — ED Notes (Signed)
Pt returned from xray and connected to the monitor 

## 2016-10-02 NOTE — Progress Notes (Signed)
Patient seen and examined at bedside, patient admitted after midnight, please see earlier detailed admission note by Dr. Blaine Hamper. No change to plan.

## 2016-10-03 DIAGNOSIS — K219 Gastro-esophageal reflux disease without esophagitis: Secondary | ICD-10-CM

## 2016-10-03 DIAGNOSIS — I749 Embolism and thrombosis of unspecified artery: Secondary | ICD-10-CM | POA: Diagnosis not present

## 2016-10-03 DIAGNOSIS — R748 Abnormal levels of other serum enzymes: Secondary | ICD-10-CM | POA: Diagnosis not present

## 2016-10-03 DIAGNOSIS — E43 Unspecified severe protein-calorie malnutrition: Secondary | ICD-10-CM

## 2016-10-03 DIAGNOSIS — E876 Hypokalemia: Secondary | ICD-10-CM | POA: Diagnosis not present

## 2016-10-03 DIAGNOSIS — N179 Acute kidney failure, unspecified: Secondary | ICD-10-CM | POA: Diagnosis not present

## 2016-10-03 DIAGNOSIS — I998 Other disorder of circulatory system: Secondary | ICD-10-CM | POA: Diagnosis not present

## 2016-10-03 DIAGNOSIS — E860 Dehydration: Secondary | ICD-10-CM | POA: Diagnosis not present

## 2016-10-03 LAB — URINE CULTURE: CULTURE: NO GROWTH

## 2016-10-03 LAB — GLUCOSE, CAPILLARY: Glucose-Capillary: 83 mg/dL (ref 65–99)

## 2016-10-03 NOTE — Clinical Social Work Note (Signed)
Clinical Social Work Assessment  Patient Details  Name: Randy Rojas MRN: [id number] Date of Birth: 03/25/1920  Date of referral:  10/03/16               Reason for consult:  Discharge Planning                Permission sought to share information with:  Family Supports Permission granted to share information::  Yes, Verbal Permission Granted  Name::     Cindee Salt  Agency::     Relationship::  niece  Contact Information:  [phone]  Housing/Transportation Living arrangements for the past 2 months:  Single Family Home Source of Information:  Other (Comment Required) (niece) Patient Interpreter Needed:  None Criminal Activity/Legal Involvement Pertinent to Current Situation/Hospitalization:  No - Comment as needed Significant Relationships:  Other Family Members Lives with:  Self Do you feel safe going back to the place where you live?  Yes Need for family participation in patient care:  Yes (Comment)  Care giving concerns:  Patient lives alone   Social Worker assessment / plan:  CSW unable to do assessment with patent due to his cognition. CSW spoke to patients niece Barnetta Chapel. Barnetta Chapel stated she is the only family he has. Barnetta Chapel is agreeable for patient to discharge to SNF. Barnetta Chapel stated patient wanted [facility] before being placed at [facility]. Barnetta Chapel stated patient was very independent and did everything on his own. CSW reached out to [place] (admissions coordinator from [facility]) she stated they are able to make bed offer to patient. Janie arranged a meeting with patients niece for 10/04/16 to fill out paperwork. CSW made MD aware of bed offer for 10/04/16  Employment status:  Retired Forensic scientist:  Medicare PT Recommendations:  [place] / Referral to community resources:  [place]  Patient/Family's Response to care:  Family verbalized appreciation and understanding for CSW role and involvement in  care.  Patient/Family's Understanding of and Emotional Response to Diagnosis, Current Treatment, and Prognosis:  Family with good understanding of current medical state and limitations around most recent hospitalization. Family agreeable with SNF placement in hopes of patient transitioning back home to his independent lifestyle.  Emotional Assessment Appearance:  Appears stated age Attitude/Demeanor/Rapport:  Unable to Assess Affect (typically observed):  Unable to Assess Orientation:  Oriented to Place, Oriented to Self Alcohol / Substance use:  Not Applicable Psych involvement (Current and /or in the community):  No (Comment)  Discharge Needs  Concerns to be addressed:  No discharge needs identified Readmission within the last 30 days:  No Current discharge risk:  None Barriers to Discharge:  No Barriers Identified   Wende Neighbors, LCSW 10/03/2016, 3:52 PM

## 2016-10-03 NOTE — Progress Notes (Signed)
PROGRESS NOTE    Randy Rojas  LPF:[id number] DOB: 1920-01-25 DOA: 10/02/2016 PCP: Kandice Hams, MD   Brief Narrative: Randy Rojas is a 83 y.o. male with medical history significant of skin cancer, multiple skin lesion in face, glaucoma, esophageal stricture (s/p of dilation). He presented with leg weakness, cold legs and multiple falls.   Assessment & Plan:   Principal Problem:   Ischemic leg Active Problems:   Fall   Protein-calorie malnutrition, severe ([place])   GERD (gastroesophageal reflux disease)   Dehydration   AKI (acute kidney injury) ([place])   Rhabdomyolysis   Hypokalemia   Hypercalcemia   Ischemic legs ABI suggestive of chronic arterial insufficiency without acute change per vascular surgery -vascular surgery recommendations: outpatient follow-up -PT recommendations: SNF  Fall Secondary to weakness. X-ray of hips negative. CT-head and c-spine without evidence of fracture -continue analgesics  GERD -continue protonix  Protein-calorie malnutrition, severe -dietician recommendations  Elevated CK Improved.  Hypokalemia Repleted  Hypercalcemia Stable. Asymptomatic and chronic -follow-up outpatient    DVT prophylaxis: Heparin Code Status: Full code Family Communication: None at bedside Disposition Plan: Discharge to SNF   Consultants:   Vascular surgery  Procedures:   ABI  Antimicrobials:  None    Subjective: Some left foot pain. No chest pain or dyspnea.  Objective: Vitals:   10/02/16 1536 10/02/16 2156 10/03/16 0531 10/03/16 0837  BP: (!) 119/45 (!) 125/57 125/61   Pulse: (!) 129 67 95   Resp: 17 18 18    Temp: 97.5 F (36.4 C) 97.8 F (36.6 C) 98.4 F (36.9 C)   TempSrc:  Oral Oral   SpO2: (!) 76% 100% 100%   Weight:    46.3 kg (102 lb 1.2 oz)  Height:    5\' 7"  (1.702 m)    Intake/Output Summary (Last 24 hours) at 10/03/16 1518 Last data filed at 10/03/16 0900  Gross per 24 hour  Intake          1999.67 ml    Output              500 ml  Net          1499.67 ml   Filed Weights   10/03/16 0837  Weight: 46.3 kg (102 lb 1.2 oz)    Examination:  General exam: Appears calm and comfortable Respiratory system: Clear to auscultation. Respiratory effort normal. Cardiovascular system: S1 & S2 heard, RRR. No murmurs. Could not palpate DP pulses Gastrointestinal system: Abdomen is nondistended, soft and nontender. Normal bowel sounds heard. Central nervous system: Alert and oriented. No focal neurological deficits. Extremities: No edema. No calf tenderness Skin: No cyanosis. No rashes Psychiatry: Judgement and insight appear normal. Mood & affect appropriate.     Data Reviewed: I have personally reviewed following labs and imaging studies  CBC:  Recent Labs Lab 10/01/16 2253 10/02/16 0139 10/02/16 0423  WBC 11.9*  --  11.3*  NEUTROABS  --  10.3*  --   HGB 14.4  --  13.5  HCT 41.6  --  39.8  MCV 88.7  --  89.4  PLT 158  --  532*   Basic Metabolic Panel:  Recent Labs Lab 10/01/16 2253 10/02/16 0423  NA 142 146*  K 3.3* 3.5  CL 102 104  CO2 29 32  GLUCOSE 131* 126*  BUN 41* 43*  CREATININE 1.24 1.23  CALCIUM 13.0* 12.9*   GFR: Estimated Creatinine Clearance: 24 mL/min (by C-G formula based on SCr of 1.23 mg/dL). Liver Function Tests:  No results for input(s): AST, ALT, ALKPHOS, BILITOT, PROT, ALBUMIN in the last 168 hours. No results for input(s): LIPASE, AMYLASE in the last 168 hours. No results for input(s): AMMONIA in the last 168 hours. Coagulation Profile:  Recent Labs Lab 10/02/16 0423  INR 1.04   Cardiac Enzymes:  Recent Labs Lab 10/01/16 2308 10/02/16 0423  CKTOTAL 882* 536*   BNP (last 3 results) No results for input(s): PROBNP in the last 8760 hours. HbA1C: No results for input(s): HGBA1C in the last 72 hours. CBG:  Recent Labs Lab 10/01/16 2249 10/02/16 0746 10/03/16 0807  GLUCAP 130* 106* 83   Lipid Profile: No results for input(s): CHOL,  HDL, LDLCALC, TRIG, CHOLHDL, LDLDIRECT in the last 72 hours. Thyroid Function Tests: No results for input(s): TSH, T4TOTAL, FREET4, T3FREE, THYROIDAB in the last 72 hours. Anemia Panel: No results for input(s): VITAMINB12, FOLATE, FERRITIN, TIBC, IRON, RETICCTPCT in the last 72 hours. Sepsis Labs:  Recent Labs Lab 10/02/16 0158  LATICACIDVEN 1.85    Recent Results (from the past 240 hour(s))  Urine culture     Status: None   Collection Time: 10/02/16  4:04 AM  Result Value Ref Range Status   Specimen Description URINE, CLEAN CATCH  Final   Special Requests NONE  Final   Culture NO GROWTH  Final   Report Status 10/03/2016 FINAL  Final         Radiology Studies: Dg Tibia/fibula Left  Result Date: 10/02/2016 CLINICAL DATA:  [place] at home today.  Swelling and erythema. EXAM: LEFT TIBIA AND FIBULA - 2 VIEW COMPARISON:  None. FINDINGS: There is no evidence of fracture or other focal bone lesions. Soft tissues are unremarkable. IMPRESSION: Negative. Electronically Signed   By: Andreas Newport M.D.   On: 10/02/2016 02:42   Ct Head Wo Contrast  Result Date: 10/02/2016 CLINICAL DATA:  [place] today. EXAM: CT HEAD WITHOUT CONTRAST CT CERVICAL SPINE WITHOUT CONTRAST TECHNIQUE: Multidetector CT imaging of the head and cervical spine was performed following the standard protocol without intravenous contrast. Multiplanar CT image reconstructions of the cervical spine were also generated. COMPARISON:  None. FINDINGS: CT HEAD FINDINGS Brain: There is no intracranial hemorrhage, mass or evidence of acute infarction. There is moderate generalized atrophy. There is mild chronic microvascular ischemic change. There is no significant extra-axial fluid collection. No acute intracranial findings are evident. Vascular: No hyperdense vessel or unexpected calcification. Skull: Normal. Negative for fracture or focal lesion. Sinuses/Orbits: No acute finding. Other: None. CT CERVICAL SPINE FINDINGS Alignment:  Normal. Skull base and vertebrae: No acute fracture. No primary bone lesion or focal pathologic process. Soft tissues and spinal canal: No prevertebral fluid or swelling. No visible canal hematoma. Disc levels: Moderate cervical degenerative disc disease. Moderate facet arthritis. Upper chest: Negative. Other: None IMPRESSION: 1. No acute intracranial findings. There is moderate generalized atrophy and chronic appearing white matter hypodensities which likely represent small vessel ischemic disease. 2. Negative for acute cervical spine fracture. Electronically Signed   By: Andreas Newport M.D.   On: 10/02/2016 05:18   Ct Cervical Spine Wo Contrast  Result Date: 10/02/2016 CLINICAL DATA:  [place] today. EXAM: CT HEAD WITHOUT CONTRAST CT CERVICAL SPINE WITHOUT CONTRAST TECHNIQUE: Multidetector CT imaging of the head and cervical spine was performed following the standard protocol without intravenous contrast. Multiplanar CT image reconstructions of the cervical spine were also generated. COMPARISON:  None. FINDINGS: CT HEAD FINDINGS Brain: There is no intracranial hemorrhage, mass or evidence of acute infarction. There is moderate  generalized atrophy. There is mild chronic microvascular ischemic change. There is no significant extra-axial fluid collection. No acute intracranial findings are evident. Vascular: No hyperdense vessel or unexpected calcification. Skull: Normal. Negative for fracture or focal lesion. Sinuses/Orbits: No acute finding. Other: None. CT CERVICAL SPINE FINDINGS Alignment: Normal. Skull base and vertebrae: No acute fracture. No primary bone lesion or focal pathologic process. Soft tissues and spinal canal: No prevertebral fluid or swelling. No visible canal hematoma. Disc levels: Moderate cervical degenerative disc disease. Moderate facet arthritis. Upper chest: Negative. Other: None IMPRESSION: 1. No acute intracranial findings. There is moderate generalized atrophy and chronic appearing white  matter hypodensities which likely represent small vessel ischemic disease. 2. Negative for acute cervical spine fracture. Electronically Signed   By: Andreas Newport M.D.   On: 10/02/2016 05:18   Dg Foot Complete Left  Result Date: 10/02/2016 CLINICAL DATA:  [place] at home today.  Swelling and erythema. EXAM: LEFT FOOT - COMPLETE 3+ VIEW COMPARISON:  None. FINDINGS: There is no evidence of fracture or dislocation. There is no evidence of arthropathy or other focal bone abnormality. Soft tissues are unremarkable. IMPRESSION: Negative. Electronically Signed   By: Andreas Newport M.D.   On: 10/02/2016 02:42   Dg Hips Bilat With Pelvis 2v  Result Date: 10/02/2016 CLINICAL DATA:  Pain after fall tonight EXAM: DG HIP (WITH OR WITHOUT PELVIS) 2V BILAT COMPARISON:  None. FINDINGS: There is no evidence of hip fracture or dislocation. There is no evidence of arthropathy or other focal bone abnormality. IMPRESSION: Negative. Electronically Signed   By: Andreas Newport M.D.   On: 10/02/2016 05:26        Scheduled Meds: . aspirin  324 mg Oral Daily  . feeding supplement (ENSURE ENLIVE)  237 mL Oral TID BM  . heparin  5,000 Units Subcutaneous Q8H  . pantoprazole  40 mg Oral Q1200  . sodium chloride flush  3 mL Intravenous Q12H   Continuous Infusions: . sodium chloride 125 mL/hr at 10/03/16 0000     LOS: 1 day     Cordelia Poche, MD [facility] 10/03/2016, 3:18 PM Pager: [phone]  If 7PM-7AM, please contact night-coverage [email or web address] Password [email or web address] 10/03/2016, 3:18 PM

## 2016-10-03 NOTE — Progress Notes (Signed)
Subjective: Interval History: none.. Comfortable. Reports some pain in his feet  Objective: Vital signs in last 24 hours: Temp:  [97.5 F (36.4 C)-98.4 F (36.9 C)] 98.4 F (36.9 C) (04/07 0531) Pulse Rate:  [67-129] 95 (04/07 0531) Resp:  [17-18] 18 (04/07 0531) BP: (119-125)/(45-61) 125/61 (04/07 0531) SpO2:  [76 %-100 %] 100 % (04/07 0531) Weight:  [102 lb 1.2 oz (46.3 kg)] 102 lb 1.2 oz (46.3 kg) (04/07 0837)  Intake/Output from previous day: 04/06 0701 - 04/07 0700 In: 3207.5 [P.O.:370; I.V.:2837.5] Out: 600 [Urine:600] Intake/Output this shift: Total I/O In: 358 [P.O.:358] Out: -   Feet warm with motor and sensory intact  Lab Results:  Recent Labs  10/01/16 2253 10/02/16 0423  WBC 11.9* 11.3*  HGB 14.4 13.5  HCT 41.6 39.8  PLT 158 125*   BMET  Recent Labs  10/01/16 2253 10/02/16 0423  NA 142 146*  K 3.3* 3.5  CL 102 104  CO2 29 32  GLUCOSE 131* 126*  BUN 41* 43*  CREATININE 1.24 1.23  CALCIUM 13.0* 12.9*    Studies/Results: Dg Tibia/fibula Left  Result Date: 10/02/2016 CLINICAL DATA:  [place] at home today.  Swelling and erythema. EXAM: LEFT TIBIA AND FIBULA - 2 VIEW COMPARISON:  None. FINDINGS: There is no evidence of fracture or other focal bone lesions. Soft tissues are unremarkable. IMPRESSION: Negative. Electronically Signed   By: Andreas Newport M.D.   On: 10/02/2016 02:42   Ct Head Wo Contrast  Result Date: 10/02/2016 CLINICAL DATA:  [place] today. EXAM: CT HEAD WITHOUT CONTRAST CT CERVICAL SPINE WITHOUT CONTRAST TECHNIQUE: Multidetector CT imaging of the head and cervical spine was performed following the standard protocol without intravenous contrast. Multiplanar CT image reconstructions of the cervical spine were also generated. COMPARISON:  None. FINDINGS: CT HEAD FINDINGS Brain: There is no intracranial hemorrhage, mass or evidence of acute infarction. There is moderate generalized atrophy. There is mild chronic microvascular ischemic change.  There is no significant extra-axial fluid collection. No acute intracranial findings are evident. Vascular: No hyperdense vessel or unexpected calcification. Skull: Normal. Negative for fracture or focal lesion. Sinuses/Orbits: No acute finding. Other: None. CT CERVICAL SPINE FINDINGS Alignment: Normal. Skull base and vertebrae: No acute fracture. No primary bone lesion or focal pathologic process. Soft tissues and spinal canal: No prevertebral fluid or swelling. No visible canal hematoma. Disc levels: Moderate cervical degenerative disc disease. Moderate facet arthritis. Upper chest: Negative. Other: None IMPRESSION: 1. No acute intracranial findings. There is moderate generalized atrophy and chronic appearing white matter hypodensities which likely represent small vessel ischemic disease. 2. Negative for acute cervical spine fracture. Electronically Signed   By: Andreas Newport M.D.   On: 10/02/2016 05:18   Ct Cervical Spine Wo Contrast  Result Date: 10/02/2016 CLINICAL DATA:  [place] today. EXAM: CT HEAD WITHOUT CONTRAST CT CERVICAL SPINE WITHOUT CONTRAST TECHNIQUE: Multidetector CT imaging of the head and cervical spine was performed following the standard protocol without intravenous contrast. Multiplanar CT image reconstructions of the cervical spine were also generated. COMPARISON:  None. FINDINGS: CT HEAD FINDINGS Brain: There is no intracranial hemorrhage, mass or evidence of acute infarction. There is moderate generalized atrophy. There is mild chronic microvascular ischemic change. There is no significant extra-axial fluid collection. No acute intracranial findings are evident. Vascular: No hyperdense vessel or unexpected calcification. Skull: Normal. Negative for fracture or focal lesion. Sinuses/Orbits: No acute finding. Other: None. CT CERVICAL SPINE FINDINGS Alignment: Normal. Skull base and vertebrae: No acute fracture. No primary  bone lesion or focal pathologic process. Soft tissues and spinal  canal: No prevertebral fluid or swelling. No visible canal hematoma. Disc levels: Moderate cervical degenerative disc disease. Moderate facet arthritis. Upper chest: Negative. Other: None IMPRESSION: 1. No acute intracranial findings. There is moderate generalized atrophy and chronic appearing white matter hypodensities which likely represent small vessel ischemic disease. 2. Negative for acute cervical spine fracture. Electronically Signed   By: Andreas Newport M.D.   On: 10/02/2016 05:18   Dg Foot Complete Left  Result Date: 10/02/2016 CLINICAL DATA:  [place] at home today.  Swelling and erythema. EXAM: LEFT FOOT - COMPLETE 3+ VIEW COMPARISON:  None. FINDINGS: There is no evidence of fracture or dislocation. There is no evidence of arthropathy or other focal bone abnormality. Soft tissues are unremarkable. IMPRESSION: Negative. Electronically Signed   By: Andreas Newport M.D.   On: 10/02/2016 02:42   Dg Hips Bilat With Pelvis 2v  Result Date: 10/02/2016 CLINICAL DATA:  Pain after fall tonight EXAM: DG HIP (WITH OR WITHOUT PELVIS) 2V BILAT COMPARISON:  None. FINDINGS: There is no evidence of hip fracture or dislocation. There is no evidence of arthropathy or other focal bone abnormality. IMPRESSION: Negative. Electronically Signed   By: Andreas Newport M.D.   On: 10/02/2016 05:26   Anti-infectives: Anti-infectives    None      Assessment/Plan: s/p * No surgery found * Noninvasive showed dampened monophasic flow in his feet. This is consistent with chronic arterial insufficiency. No evidence of acute change. Will not follow actively. If he develops worsening problems could be worked up for revascularization. will not follow actively. Agent can follow-up with Dr. Donzetta Matters in the office for ongoing issues   LOS: 1 day   Rehema Muffley 10/03/2016, 9:08 AM

## 2016-10-03 NOTE — Evaluation (Signed)
Physical Therapy Evaluation Patient Details Name: Randy Rojas MRN: [id number] DOB: [birth year] Today's Date: 10/03/2016   History of Present Illness  Randy Rojas is a 83 y.o. male with medical history significant of skin cancer, multiple skin lesion in face, glaucoma, esophageal stricture (s/p of dilation), who presents with cold legs, leg weakness and multiple falls.  Clinical Impression  Patient presents with decreased independence and safety with mobility due to deficits listed in PT problem list.  He will benefit from skilled PT in the acute setting and follow up SNF level rehab at d/c as he lives alone, recent multiple falls, currently unstable and unable to walk safely in the room unaided.  Complains of foot pain and note neuropathetic type dennervation of ankle dorsiflexors. Agree with continued vascular work up to improve mobility.     Follow Up Recommendations SNF;Supervision/Assistance - 24 hour    Equipment Recommendations  None recommended by PT    Recommendations for Other Services       Precautions / Restrictions Precautions Precautions: Fall      Mobility  Bed Mobility Overal bed mobility: Needs Assistance Bed Mobility: Supine to Sit     Supine to sit: Min assist     General bed mobility comments: to lift trunk  Transfers Overall transfer level: Needs assistance Equipment used: Rolling walker (2 wheeled) Transfers: Sit to/from Stand Sit to Stand: Mod assist         General transfer comment: lifting help from EOB, pt posterior initially  Ambulation/Gait Ambulation/Gait assistance: Min assist;Mod assist Ambulation Distance (Feet): 12 Feet Assistive device: Rolling walker (2 wheeled) Gait Pattern/deviations: Step-to pattern;Decreased stride length;Trunk flexed;Decreased dorsiflexion - right;Decreased dorsiflexion - left     General Gait Details: foot drop demonstrated bilaterally and noted posterior bias and flexed posture with decreased ankle  ROM; stepping slowly and with steppage gait for clearance, pt reports felt unsteady and fearful of falling, assist to manuever walker at times and great difficulty backing up to chair  Stairs            Wheelchair Mobility    Modified Rankin (Stroke Patients Only)       Balance Overall balance assessment: Needs assistance   Sitting balance-Leahy Scale: Fair     Standing balance support: Bilateral upper extremity supported Standing balance-Leahy Scale: Poor Standing balance comment: UE support for balance                             Pertinent Vitals/Pain Pain Assessment: Faces Faces Pain Scale: Hurts even more Pain Location: feet Pain Descriptors / Indicators: Sore Pain Intervention(s): Monitored during session    Home Living Family/patient expects to be discharged to:: Skilled nursing facility Living Arrangements: Alone Available Help at Discharge: Family;Available PRN/intermittently (neice) Type of Home: House Home Access: Stairs to enter   [employer] of Steps: 2? Home Layout: Two level Home Equipment: Walker - 2 wheels;Cane - quad      Prior Function Level of Independence: Independent with assistive device(s)         Comments: reports neice helps with some things, but he still drives     Hand Dominance   Dominant Hand: Left    Extremity/Trunk Assessment   Upper Extremity Assessment Upper Extremity Assessment: Generalized weakness    Lower Extremity Assessment Lower Extremity Assessment: LLE deficits/detail;RLE deficits/detail RLE Deficits / Details: limited ankle DF and weakness bilaterally LLE Deficits / Details: limited ankle DF and weakness bilaterally  Cervical / Trunk Assessment Cervical / Trunk Assessment: Kyphotic  Communication   Communication: HOH  Cognition Arousal/Alertness: Awake/alert Behavior During Therapy: WFL for tasks assessed/performed Overall Cognitive Status: No family/caregiver present to  determine baseline cognitive functioning                                 General Comments: disoriented to time      General Comments General comments (skin integrity, edema, etc.): multiple leisons on face and ears from skin cancer    Exercises     Assessment/Plan    PT Assessment Patient needs continued PT services  PT Problem List Decreased strength;Decreased activity tolerance;Decreased balance;Decreased knowledge of use of DME;Decreased mobility;Decreased knowledge of precautions;Impaired sensation       PT Treatment Interventions Therapeutic activities;DME instruction;Gait training;Therapeutic exercise;Patient/family education;Functional mobility training;Balance training    PT Goals (Current goals can be found in the Care Plan section)  Acute Rehab PT Goals Patient Stated Goal: To get stronger, not fall PT Goal Formulation: With patient Time For Goal Achievement: 10/17/16 Potential to Achieve Goals: Fair    Frequency Min 3X/week   Barriers to discharge Decreased caregiver support      Co-evaluation               End of Session Equipment Utilized During Treatment: Gait belt Activity Tolerance: Patient limited by fatigue Patient left: in chair;with call bell/phone within reach;with chair alarm set Nurse Communication: Mobility status PT Visit Diagnosis: Muscle weakness (generalized) (M62.81);Other symptoms and signs involving the nervous system (R29.898);Other abnormalities of gait and mobility (R26.89);Repeated falls (R29.6)    Time: 0626-9485 PT Time Calculation (min) (ACUTE ONLY): 32 min   Charges:   PT Evaluation $PT Eval Moderate Complexity: 1 Procedure PT Treatments $Gait Training: 8-22 mins   PT G Codes:   PT G-Codes **NOT FOR INPATIENT CLASS** Functional Assessment Tool Used: AM-PAC 6 Clicks Basic Mobility Functional Limitation: Mobility: Walking and moving around Mobility: Walking and Moving Around Current Status (I6270): At  least 60 percent but less than 80 percent impaired, limited or restricted Mobility: Walking and Moving Around Goal Status [phone]): At least 40 percent but less than 60 percent impaired, limited or restricted    [place], [place] 10/03/2016   Reginia Naas 10/03/2016, 2:03 PM

## 2016-10-04 DIAGNOSIS — E860 Dehydration: Secondary | ICD-10-CM | POA: Diagnosis not present

## 2016-10-04 DIAGNOSIS — R748 Abnormal levels of other serum enzymes: Secondary | ICD-10-CM

## 2016-10-04 DIAGNOSIS — N179 Acute kidney failure, unspecified: Secondary | ICD-10-CM

## 2016-10-04 DIAGNOSIS — E876 Hypokalemia: Secondary | ICD-10-CM

## 2016-10-04 DIAGNOSIS — I749 Embolism and thrombosis of unspecified artery: Secondary | ICD-10-CM | POA: Diagnosis not present

## 2016-10-04 DIAGNOSIS — I998 Other disorder of circulatory system: Secondary | ICD-10-CM | POA: Diagnosis not present

## 2016-10-04 LAB — GLUCOSE, CAPILLARY: GLUCOSE-CAPILLARY: 102 mg/dL — AB (ref 65–99)

## 2016-10-04 MED ORDER — ENSURE ENLIVE PO LIQD: 237.0000 mL | Freq: Three times a day (TID) | ORAL | Status: AC

## 2016-10-04 MED ORDER — ASPIRIN 81 MG PO CHEW: 324.0000 mg | CHEWABLE_TABLET | Freq: Every day | ORAL | Status: AC

## 2016-10-04 MED ORDER — PANTOPRAZOLE SODIUM 40 MG PO TBEC: 40.0000 mg | DELAYED_RELEASE_TABLET | Freq: Every day | ORAL | Status: AC

## 2016-10-04 NOTE — Progress Notes (Signed)
Pt's niece Barnetta Chapel at ph: [phone] notified of pt's upcoming D/C to [facility].  Niece appreciative and thanked the CSW.  Please reconsult if future social work needs arise.  CSW signing off.  Alphonse Guild. Micael Barb, Latanya Presser, LCAS Clinical Social Worker Ph: [phone]

## 2016-10-04 NOTE — Clinical Social Work Placement (Signed)
   CLINICAL SOCIAL WORK PLACEMENT  NOTE  Date:  10/04/2016  Patient Details  Name: Randy Rojas MRN: [id number] Date of Birth: 03-Aug-1919  Clinical Social Work is seeking post-discharge placement for this patient at the [place] level of care (*CSW will initial, date and re-position this form in  chart as items are completed):  No   Patient/family provided with [place] Work Department's list of facilities offering this level of care within the geographic area requested by the patient (or if unable, by the patient's family).  Yes   Patient/family informed of their freedom to choose among providers that offer the needed level of care, that participate in Medicare, Medicaid or managed care program needed by the patient, have an available bed and are willing to accept the patient.  Yes   Patient/family informed of [facility]'s ownership interest in [facility] and [facility], as well as of the fact that they are under no obligation to receive care at these facilities.  PASRR submitted to EDS on     10/03/16  PASRR number received on     10/03/16  Existing PASRR number confirmed on    10/03/16   FL2 transmitted to all facilities in geographic area requested by pt/family on     10/03/16  FL2 transmitted to all facilities within larger geographic area on     N/A  Patient informed that his/her managed care company has contracts with or will negotiate with certain facilities, including the following:    Unknown      Yes  Patient/family informed of bed offers received.  Patient chooses bed at [facility]     Physician recommends and patient chooses bed at     N/a Patient to be transferred to   on  .10/05/15  Patient to be transferred to facility by     [facility]  Patient family notified on   of transfer.10/04/16  Name of family member notified:      Barnetta Chapel, niece  PHYSICIAN Please sign FL2     Additional Comment:     _______________________________________________ Claudine Mouton, LCSWA 10/04/2016, 11:40 AM

## 2016-10-04 NOTE — NC FL2 (Signed)
[place] LEVEL OF CARE SCREENING TOOL     IDENTIFICATION  Patient Name: Randy Rojas Birthdate: 1919-11-12 Sex: male Admission Date (Current Location): 10/02/2016  [facility] and [place] Number:  Herbalist and Address:  [facility] [place], [place] [address], [place], [place] [address]      Provider Number: [id number]  Attending Physician Name and Address:  Mariel Aloe, MD  Relative Name and Phone Number:  Barnetta Chapel [phone]    Current Level of Care: Hospital Recommended Level of Care: [place] Prior Approval Number:    Date Approved/Denied:   PASRR Number: [id number]  Discharge Plan: SNF    Current Diagnoses: Patient Active Problem List   Diagnosis Date Noted  . Ischemic leg 10/02/2016  . Fall 10/02/2016  . Protein-calorie malnutrition, severe ([place]) 10/02/2016  . GERD (gastroesophageal reflux disease) 10/02/2016  . Dehydration 10/02/2016  . AKI (acute kidney injury) ([place]) 10/02/2016  . Rhabdomyolysis 10/02/2016  . Hypokalemia 10/02/2016  . Hypercalcemia 10/02/2016  . Generalized weakness     Orientation RESPIRATION BLADDER Height & Weight     Self, Place  Normal Continent Weight: 102 lb 1.2 oz (46.3 kg) Height:  5\' 7"  (170.2 cm)  BEHAVIORAL SYMPTOMS/MOOD NEUROLOGICAL BOWEL NUTRITION STATUS      Incontinent  (DSY2)  AMBULATORY STATUS COMMUNICATION OF NEEDS Skin   Extensive Assist Verbally Normal                       Personal Care Assistance Level of Assistance  Bathing, Feeding, Dressing Bathing Assistance: Limited assistance Feeding assistance: Independent Dressing Assistance: Limited assistance     Functional Limitations Info  Sight, Hearing, Speech Sight Info: Adequate Hearing Info: Adequate Speech Info: Adequate    SPECIAL CARE FACTORS FREQUENCY  OT (By licensed OT), PT (By licensed PT)     PT Frequency: 5x week OT Frequency: 5x week            Contractures  Contractures Info: Not present    Additional Factors Info  Code Status Code Status Info: Full cOde             Current Medications (10/04/2016):  This is the current hospital active medication list Current Facility-Administered Medications  Medication Dose Route Frequency Provider Last Rate Last Dose  . 0.9 %  sodium chloride infusion   Intravenous Continuous Ivor Costa, MD 125 mL/hr at 10/04/16 0052 1,000 mL at 10/04/16 0052  . acetaminophen (TYLENOL) tablet 650 mg  650 mg Oral Q6H PRN Ivor Costa, MD   650 mg at 10/03/16 1631   Or  . acetaminophen (TYLENOL) suppository 650 mg  650 mg Rectal Q6H PRN Ivor Costa, MD      . aspirin chewable tablet 324 mg  324 mg Oral Daily Ivor Costa, MD   324 mg at 10/04/16 1032  . feeding supplement (ENSURE ENLIVE) (ENSURE ENLIVE) liquid 237 mL  237 mL Oral TID BM Mariel Aloe, MD   237 mL at 10/04/16 1000  . heparin injection 5,000 Units  5,000 Units Subcutaneous Q8H Ivor Costa, MD   5,000 Units at 10/04/16 0703  . HYDROcodone-acetaminophen (NORCO/VICODIN) 5-325 MG per tablet 1-2 tablet  1-2 tablet Oral Q6H PRN Ivor Costa, MD      . ondansetron [facility]) tablet 4 mg  4 mg Oral Q6H PRN Ivor Costa, MD       Or  . ondansetron [facility]) injection 4 mg  4 mg Intravenous Q6H PRN  Ivor Costa, MD      . pantoprazole (PROTONIX) EC tablet 40 mg  40 mg Oral Q1200 Ivor Costa, MD   40 mg at 10/03/16 1631  . sodium chloride flush (NS) 0.9 % injection 3 mL  3 mL Intravenous Q12H Ivor Costa, MD   3 mL at 10/03/16 2320  . zolpidem (AMBIEN) tablet 5 mg  5 mg Oral QHS PRN Ivor Costa, MD         Discharge Medications: Please see discharge summary for a list of discharge medications.  Relevant Imaging Results:  Relevant Lab Results:   Additional Information SS# [id number]  Jorge Ny, LCSW

## 2016-10-04 NOTE — Progress Notes (Signed)
NURSING PROGRESS NOTE  HOGAN HOOBLER [id number] Discharge Data: 10/04/2016 12:09 PM Attending Provider: Mariel Aloe, MD FPU:[id number],UNHRVA D, MD   Harland Dingwall to be D/C'd Skilled nursing facility per MD order.    All IV's will be discontinued and monitored for bleeding.  All belongings will be returned to patient.  Patient taken to SNF by PTAR.  Last Documented Vital Signs:  Blood pressure (!) 112/56, pulse 83, temperature 98.2 F (36.8 C), resp. rate 18, height 5\' 7"  (1.702 m), weight 46.3 kg (102 lb 1.2 oz), SpO2 98 %.  Joslyn Hy, MSN, RN, [employer]

## 2016-10-04 NOTE — Progress Notes (Signed)
Pt is medically stable for D/C to [facility].  RN/CSW will call and arrange EMS for transport.  Per admissions coordinator at [facility] pt will go to room 205.  CSW sent D/C summary to Janie(admissions person) via the hub.  CSW contacted/spoke to the pt's niece Barnetta Chapel and they are aware of the D/C plan.  Niece is in agreement with the plan.  Please reconsult with CSW in future if needed.   # for report: [phone] Accepting physician: Dr. Polo Riley Room #           Alphonse Guild. Dawnelle Warman, LCSWA, LCAS

## 2016-10-04 NOTE — Discharge Summary (Signed)
Physician Discharge Summary  Randy Rojas:[id number] DOB: [birth year] DOA: 10/02/2016  PCP: Kandice Hams, MD  Admit date: 10/02/2016 Discharge date: 10/04/2016  Admitted From: Home Disposition: SNF  Recommendations for Outpatient Follow-up:  1. Follow up with PCP in 1 week 2. Please obtain BMP to recheck calcium. This appears to be a chronic issue   Discharge Condition: Stable CODE STATUS: Full code Diet recommendation: Dysphagia 2 diet; thin liquids   Brief/Interim Summary:  Admission HPI written by Ivor Costa, MD   Chief Complaint: Cold legs, leg weakness, and multiple fall  HPI: Randy Rojas is a 83 y.o. male with medical history significant of skin cancer, multiple skin lesion in face, glaucoma, esophageal stricture (s/p of dilation), who presents with cold legs, leg weakness and multiple fall.  Per pt's niece, pt is usually very active and lives alone. Does not take any medications except for nutrition supplement. Since Tuesday, patient started feeling weak and cold in legs. He denies any tenderness in legs. No loss of sensation in legs. Denies claudication. He had multiple falls due to weakness. He probably injured his head. He complains bilateral mild hip pain. Patient was seen by his PCP on 08/2015, and diagnosed him as cellulitis in legs, and started him with Augmentin. Patient has been taking the antibiotics intermittently without help. Patient states that he has heart burning sometimes, but no nausea, vomiting, diarrhea or abdominal pain. No symptoms of UTI. Denies any chest pain, SOB, cough, fever or chills. He had another fall and was found to be on the floor by his niece today. He was brought to ED tonight for evaluation. He has been losing weight for several weeks.   ED Course: pt was found to have WBC 11.9, lactate of 1.85, urinalysis with trace amount of leukocyte, mild acute renal injury with creatinine 1.24, potassium 3.3, calcium 13, calcium 13.0, temperature  normal, oxygen saturation 100% on room air. X-ray of left foot and left tibia/fibular negative. His pulses of DP/PT are not palpable bilaterally. Patient is admitted to telemetry bed as inpatient. VVS, dr. Donzetta Matters was consulted.    Hospital course:  Ischemic legs ABI suggestive of chronic arterial insufficiency without acute change per vascular surgery. No acute management. Follow-up as an outpatient. Physical therapy recommended SNF at discharge.  Fall Secondary to weakness. X-ray of hips negative. CT-head and c-spine without evidence of fracture. SNF at discharge.  GERD Given Protonix. Can consider discontinuing if symptoms improve to decrease pneumonia and fracture risk.  Protein-calorie malnutrition, severe Protein supplement started per dietician recommendations.  Elevated CK Elevated to 882 and trended down to 536. Given IV fluids.  Hypokalemia Repleted and resolved.  Hypercalcemia Stable. Asymptomatic and chronic. Outpatient follow-up.  Discharge Diagnoses:  Principal Problem:   Ischemic leg Active Problems:   Fall   Protein-calorie malnutrition, severe ([place])   GERD (gastroesophageal reflux disease)   Dehydration   AKI (acute kidney injury) ([place])   Rhabdomyolysis   Hypokalemia   Hypercalcemia    Discharge Instructions  Discharge Instructions    Diet - low sodium heart healthy    Complete by:  As directed    Increase activity slowly    Complete by:  As directed      Allergies as of 10/04/2016   No Known Allergies     Medication List    STOP taking these medications   amoxicillin-clavulanate 400-57 MG/5ML suspension Commonly known as:  AUGMENTIN     TAKE these medications   aspirin 81 MG chewable  tablet Chew 4 tablets (324 mg total) by mouth daily. Start taking on:  10/05/2016   feeding supplement (ENSURE ENLIVE) Liqd Take 237 mLs by mouth 3 (three) times daily between meals.   pantoprazole 40 MG tablet Commonly known as:  PROTONIX Take 1  tablet (40 mg total) by mouth daily at 12 noon.       Contact information for follow-up providers    Servando Snare, MD. Schedule an appointment as soon as possible for a visit.   Specialties:  Vascular Surgery, Cardiology Contact information: [address] [place] [address] [phone]            Contact information for after-discharge care    Destination    [facility] Follow up.   Specialty:  [place] information: [place] [phone]                 No Known Allergies  Consultations:  Vascular surgery   Procedures/Studies: Dg Tibia/fibula Left  Result Date: 10/02/2016 CLINICAL DATA:  [place] at home today.  Swelling and erythema. EXAM: LEFT TIBIA AND FIBULA - 2 VIEW COMPARISON:  None. FINDINGS: There is no evidence of fracture or other focal bone lesions. Soft tissues are unremarkable. IMPRESSION: Negative. Electronically Signed   By: Andreas Newport M.D.   On: 10/02/2016 02:42   Ct Head Wo Contrast  Result Date: 10/02/2016 CLINICAL DATA:  [place] today. EXAM: CT HEAD WITHOUT CONTRAST CT CERVICAL SPINE WITHOUT CONTRAST TECHNIQUE: Multidetector CT imaging of the head and cervical spine was performed following the standard protocol without intravenous contrast. Multiplanar CT image reconstructions of the cervical spine were also generated. COMPARISON:  None. FINDINGS: CT HEAD FINDINGS Brain: There is no intracranial hemorrhage, mass or evidence of acute infarction. There is moderate generalized atrophy. There is mild chronic microvascular ischemic change. There is no significant extra-axial fluid collection. No acute intracranial findings are evident. Vascular: No hyperdense vessel or unexpected calcification. Skull: Normal. Negative for fracture or focal lesion. Sinuses/Orbits: No acute finding. Other: None. CT CERVICAL SPINE FINDINGS Alignment: Normal. Skull base and vertebrae:  No acute fracture. No primary bone lesion or focal pathologic process. Soft tissues and spinal canal: No prevertebral fluid or swelling. No visible canal hematoma. Disc levels: Moderate cervical degenerative disc disease. Moderate facet arthritis. Upper chest: Negative. Other: None IMPRESSION: 1. No acute intracranial findings. There is moderate generalized atrophy and chronic appearing white matter hypodensities which likely represent small vessel ischemic disease. 2. Negative for acute cervical spine fracture. Electronically Signed   By: Andreas Newport M.D.   On: 10/02/2016 05:18   Ct Cervical Spine Wo Contrast  Result Date: 10/02/2016 CLINICAL DATA:  [place] today. EXAM: CT HEAD WITHOUT CONTRAST CT CERVICAL SPINE WITHOUT CONTRAST TECHNIQUE: Multidetector CT imaging of the head and cervical spine was performed following the standard protocol without intravenous contrast. Multiplanar CT image reconstructions of the cervical spine were also generated. COMPARISON:  None. FINDINGS: CT HEAD FINDINGS Brain: There is no intracranial hemorrhage, mass or evidence of acute infarction. There is moderate generalized atrophy. There is mild chronic microvascular ischemic change. There is no significant extra-axial fluid collection. No acute intracranial findings are evident. Vascular: No hyperdense vessel or unexpected calcification. Skull: Normal. Negative for fracture or focal lesion. Sinuses/Orbits: No acute finding. Other: None. CT CERVICAL SPINE FINDINGS Alignment: Normal. Skull base and vertebrae: No acute fracture. No primary bone lesion or focal pathologic process. Soft tissues and spinal canal: No prevertebral fluid  or swelling. No visible canal hematoma. Disc levels: Moderate cervical degenerative disc disease. Moderate facet arthritis. Upper chest: Negative. Other: None IMPRESSION: 1. No acute intracranial findings. There is moderate generalized atrophy and chronic appearing white matter hypodensities which likely  represent small vessel ischemic disease. 2. Negative for acute cervical spine fracture. Electronically Signed   By: Andreas Newport M.D.   On: 10/02/2016 05:18   Dg Foot Complete Left  Result Date: 10/02/2016 CLINICAL DATA:  [place] at home today.  Swelling and erythema. EXAM: LEFT FOOT - COMPLETE 3+ VIEW COMPARISON:  None. FINDINGS: There is no evidence of fracture or dislocation. There is no evidence of arthropathy or other focal bone abnormality. Soft tissues are unremarkable. IMPRESSION: Negative. Electronically Signed   By: Andreas Newport M.D.   On: 10/02/2016 02:42   Dg Hips Bilat With Pelvis 2v  Result Date: 10/02/2016 CLINICAL DATA:  Pain after fall tonight EXAM: DG HIP (WITH OR WITHOUT PELVIS) 2V BILAT COMPARISON:  None. FINDINGS: There is no evidence of hip fracture or dislocation. There is no evidence of arthropathy or other focal bone abnormality. IMPRESSION: Negative. Electronically Signed   By: Andreas Newport M.D.   On: 10/02/2016 05:26     ABI (10/02/2016)  ABI completed: Unable to obtain bilateral ABI's due to non-compressible arteries likely due to medial calcification. Right TBI of 0.31 and left TBI of 0.24 are suggestive of abnormal arterial flow at rest.   RIGHT    LEFT    PRESSURE WAVEFORM  PRESSURE WAVEFORM  BRACHIAL 123 Triphasic BRACHIAL 125 Triphasic  DP >254 Monophasic DP >254 Monophasic  PT >254 Monophasic PT >254 Monophasic  GREAT TOE 39 NA GREAT TOE 30 NA    RIGHT LEFT  ABI       Subjective: Patient reports improved chronic pain in left foot. No chest pain or dyspnea. No nausea.  Discharge Exam: Vitals:   10/03/16 2108 10/04/16 0529  BP: (!) 141/61 (!) 112/56  Pulse: 72 83  Resp: 18 18  Temp: 97.5 F (36.4 C) 98.2 F (36.8 C)   Vitals:   10/03/16 0837 10/03/16 1523 10/03/16 2108 10/04/16 0529  BP:  (!) 146/59 (!) 141/61 (!) 112/56  Pulse:  71 72 83  Resp:  20 18 18   Temp:  98 F (36.7 C) 97.5 F (36.4 C) 98.2 F (36.8 C)   TempSrc:      SpO2:  100% 100% 98%  Weight: 46.3 kg (102 lb 1.2 oz)     Height: 5\' 7"  (1.702 m)       General exam: Appears calm and comfortable Respiratory system: Clear to auscultation. Respiratory effort normal. Cardiovascular system: S1 & S2 heard, RRR. No murmurs. Could not palpate DP pulses Gastrointestinal system: Abdomen is nondistended, soft and nontender. Normal bowel sounds heard. Central nervous system: Alert and oriented. No focal neurological deficits. Extremities: No edema. No calf tenderness  The results of significant diagnostics from this hospitalization (including imaging, microbiology, ancillary and laboratory) are listed below for reference.     Microbiology: Recent Results (from the past 240 hour(s))  Urine culture     Status: None   Collection Time: 10/02/16  4:04 AM  Result Value Ref Range Status   Specimen Description URINE, CLEAN CATCH  Final   Special Requests NONE  Final   Culture NO GROWTH  Final   Report Status 10/03/2016 FINAL  Final     Labs: BNP (last 3 results)  Recent Labs  02/14/16 1625  BNP 39.7  Basic Metabolic Panel:  Recent Labs Lab 10/01/16 2253 10/02/16 0423  NA 142 146*  K 3.3* 3.5  CL 102 104  CO2 29 32  GLUCOSE 131* 126*  BUN 41* 43*  CREATININE 1.24 1.23  CALCIUM 13.0* 12.9*   CBC:  Recent Labs Lab 10/01/16 2253 10/02/16 0139 10/02/16 0423  WBC 11.9*  --  11.3*  NEUTROABS  --  10.3*  --   HGB 14.4  --  13.5  HCT 41.6  --  39.8  MCV 88.7  --  89.4  PLT 158  --  125*   Cardiac Enzymes:  Recent Labs Lab 10/01/16 2308 10/02/16 0423  CKTOTAL 882* 536*   BNP: Invalid input(s): POCBNP CBG:  Recent Labs Lab 10/01/16 2249 10/02/16 0746 10/03/16 0807 10/04/16 0833  GLUCAP 130* 106* 83 102*   Urinalysis    Component Value Date/Time   COLORURINE AMBER (A) 10/01/2016 2311   APPEARANCEUR CLOUDY (A) 10/01/2016 2311   LABSPEC 1.014 10/01/2016 2311   PHURINE 5.0 10/01/2016 2311   GLUCOSEU  NEGATIVE 10/01/2016 2311   HGBUR LARGE (A) 10/01/2016 2311   BILIRUBINUR NEGATIVE 10/01/2016 2311   KETONESUR NEGATIVE 10/01/2016 2311   PROTEINUR 30 (A) 10/01/2016 2311   NITRITE NEGATIVE 10/01/2016 2311   LEUKOCYTESUR TRACE (A) 10/01/2016 2311   Microbiology Recent Results (from the past 240 hour(s))  Urine culture     Status: None   Collection Time: 10/02/16  4:04 AM  Result Value Ref Range Status   Specimen Description URINE, CLEAN CATCH  Final   Special Requests NONE  Final   Culture NO GROWTH  Final   Report Status 10/03/2016 FINAL  Final     Time coordinating discharge: Over 30 minutes  SIGNED:   Cordelia Poche, MD [facility] 10/04/2016, 11:22 AM Pager [phone]  If 7PM-7AM, please contact night-coverage [email or web address] Password [email or web address]

## 2016-10-09 ENCOUNTER — Encounter: Payer: Self-pay | Admitting: Vascular Surgery

## 2016-10-09 ENCOUNTER — Ambulatory Visit (INDEPENDENT_AMBULATORY_CARE_PROVIDER_SITE_OTHER): Payer: Medicare Other | Admitting: Vascular Surgery

## 2016-10-09 VITALS — BP 117/58 | HR 72 | Temp 98.4°F | Resp 16 | Ht 67.0 in | Wt 102.0 lb

## 2016-10-09 DIAGNOSIS — I739 Peripheral vascular disease, unspecified: Secondary | ICD-10-CM | POA: Diagnosis not present

## 2016-10-09 NOTE — Progress Notes (Signed)
Patient ID: Randy Rojas, male   DOB: 11-08-1919, 83 y.o.   MRN: [id number]  Reason for Consult: New Evaluation (was seen in hosital by Dr Early  generalized weakness and falls)   Referred by Seward Carol, MD  Subjective:     HPI:  Randy Rojas is a 83 y.o. male follows up from recent hospitalization where he was noted to have bilateral lower extremity left greater than right pain in his feet. He also has associated occasional swelling. He is now residing in a nursing facility. He does have some intermittent wounds of his left lower extremity but they're nonpainful. He has no tissue loss or ulceration of his toes or actual feet. He is a lifelong nonsmoker nondiabetic. I've asked him to start taking aspirin today.  Past Medical History:  Diagnosis Date  . Glaucoma   . Skin cancer    Family History  Problem Relation Age of Onset  . Heart attack Brother    Past Surgical History:  Procedure Laterality Date  . ESOPHAGEAL DILATION      Short Social History:  Social History  Substance Use Topics  . Smoking status: Never Smoker  . Smokeless tobacco: Never Used  . Alcohol use No    No Known Allergies  Current Outpatient Prescriptions  Medication Sig Dispense Refill  . aspirin 81 MG chewable tablet Chew 4 tablets (324 mg total) by mouth daily.    . feeding supplement, ENSURE ENLIVE, (ENSURE ENLIVE) LIQD Take 237 mLs by mouth 3 (three) times daily between meals.    . pantoprazole (PROTONIX) 40 MG tablet Take 1 tablet (40 mg total) by mouth daily at 12 noon.    Marland Kitchen amoxicillin-clavulanate (AUGMENTIN) 400-57 MG/5ML suspension      No current facility-administered medications for this visit.     Review of Systems  Constitutional:  Constitutional negative. HENT: HENT negative.  Eyes: Eyes negative.  Respiratory: Respiratory negative.  Cardiovascular: Cardiovascular negative.  GI: Gastrointestinal negative.  Musculoskeletal: Positive for leg pain.  Skin: Positive for  rash.  Neurological: Positive for focal weakness.  Hematologic: Hematologic/lymphatic negative.  Psychiatric: Psychiatric negative.        Objective:  Objective   Vitals:   10/09/16 1038  BP: (!) 117/58  Pulse: 72  Resp: 16  Temp: 98.4 F (36.9 C)  SpO2: 100%  Weight: 102 lb (46.3 kg)  Height: 5\' 7"  (1.702 m)   Body mass index is 15.98 kg/m.  Physical Exam  Constitutional: He is oriented to person, place, and time.  Appears malnourished  Eyes: Pupils are equal, round, and reactive to light.  Neck: Normal range of motion.  Cardiovascular:  Pulses:      Radial pulses are 2+ on the right side, and 2+ on the left side.       Femoral pulses are 2+ on the right side, and 2+ on the left side.      Popliteal pulses are 2+ on the right side, and 2+ on the left side.  Monophasic bilateral dp/pt  Abdominal: Soft. He exhibits no mass.  Musculoskeletal: Normal range of motion. He exhibits edema.  Neurological: He is alert and oriented to person, place, and time.  Skin:  Multiple scabs to face  Psychiatric: He has a normal mood and affect. His behavior is normal. Judgment and thought content normal.    Data: Summary: Unable to obtain bilateral ABI&'s due to non-compressible arteries likely due to medial calcification.  Right TBI of 0.31 and left TBI of 0.24  are suggestive of abnormal arterial flow at rest.     Assessment/Plan:     83yo male with bilateral lower extremity left greater than right rest pain and toe pressures that are depressed. He does not have wounds that he is relatively active although currently living in a nursing facility. Because of this I have offered him aortogram bilateral lower extremity runoff possible intervention of the left to start. I have asked him to start taking aspirin. At this time he is unsure of any procedures and wishes to discuss with his family. Should he decide to proceed he can call and we'll schedule him for the above noted procedure  he does not be seen again in the office prior to that. He can otherwise follow up on a prn basis.      Waynetta Sandy MD [facility]

## 2017-04-29 DEATH — deceased

## 2018-01-10 IMAGING — CT CT HEAD W/O CM
5 of 7 series · 17 of 47 positions shown, 18 images · non-contrast
Comparison: None.

CLINICAL DATA: Fell today.

EXAM:
CT HEAD WITHOUT CONTRAST
CT CERVICAL SPINE WITHOUT CONTRAST
TECHNIQUE: Multidetector CT imaging of the head and cervical spine was
performed following the standard protocol without intravenous
contrast. Multiplanar CT image reconstructions of the cervical spine
were also generated.

[Series 4: head without · axial · non-contrast · 0.42mm/px · z∈[-164,-89]mm · 3 of 31 slices shown, 4 images]
[im 8/31  brain]
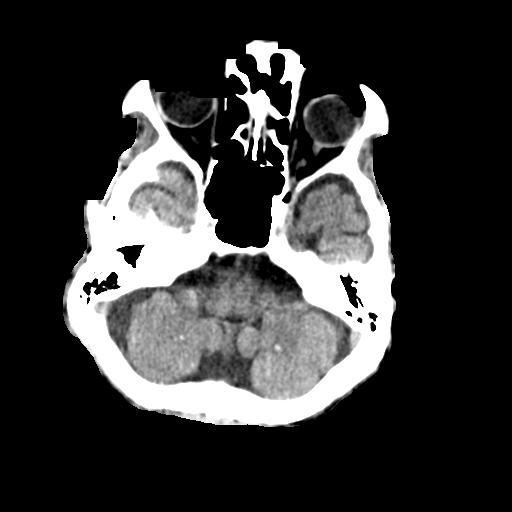
[im 8/31  bone]
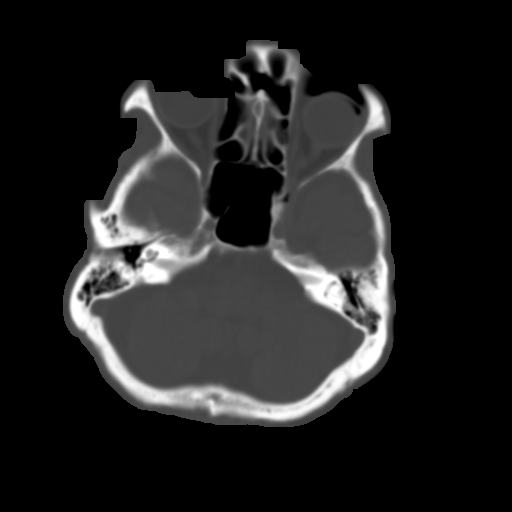
[im 16/31  brain]
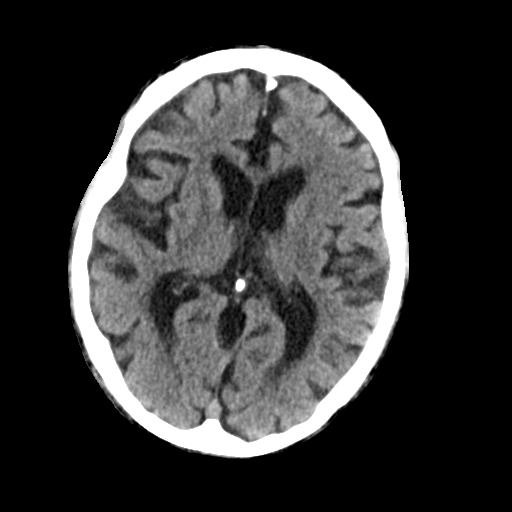
[im 23/31  brain]
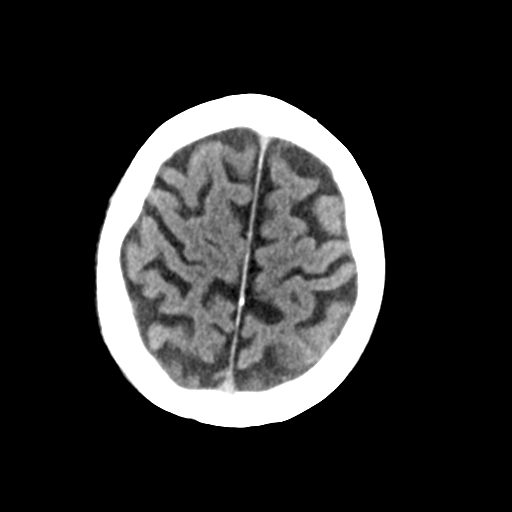

[Series 5: head bone · axial · 0.42mm/px · z∈[-187,-63]mm · 8 of 76 slices shown]
[im 7/76  bone]
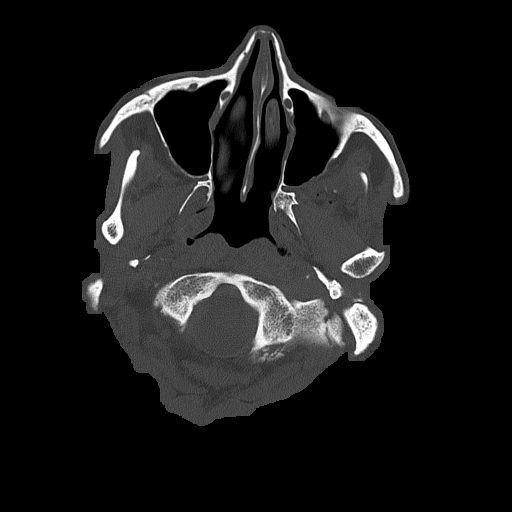
[im 14/76  bone]
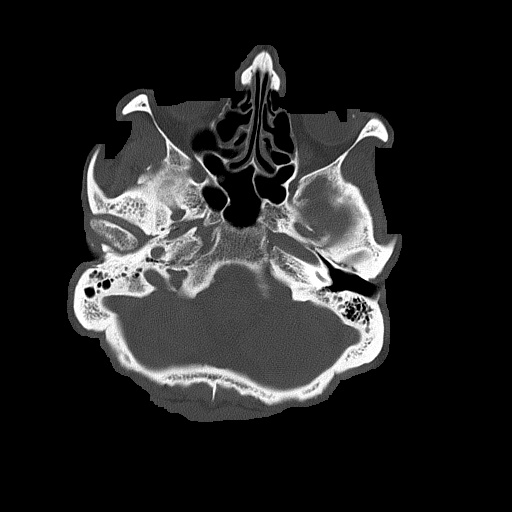
[im 28/76  bone]
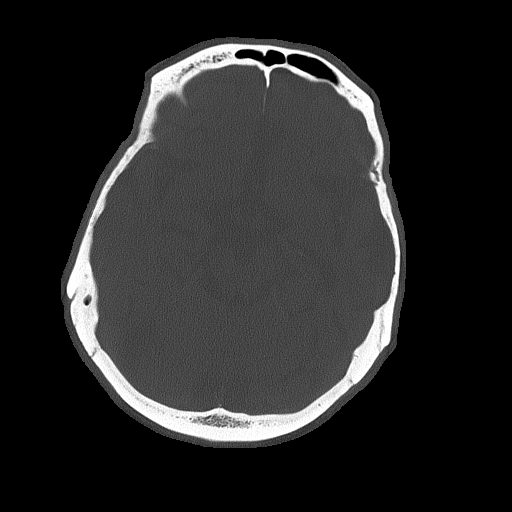
[im 35/76  bone]
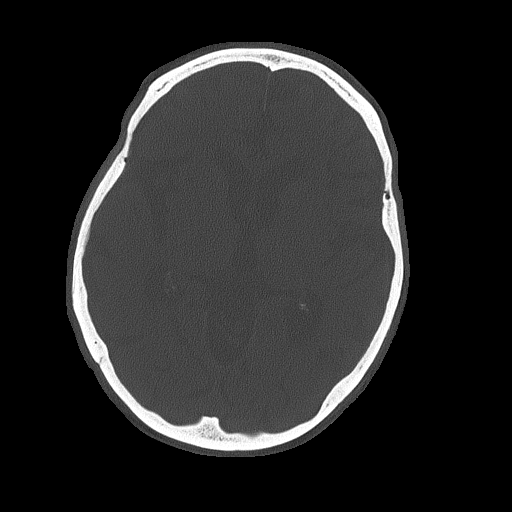
[im 41/76  bone]
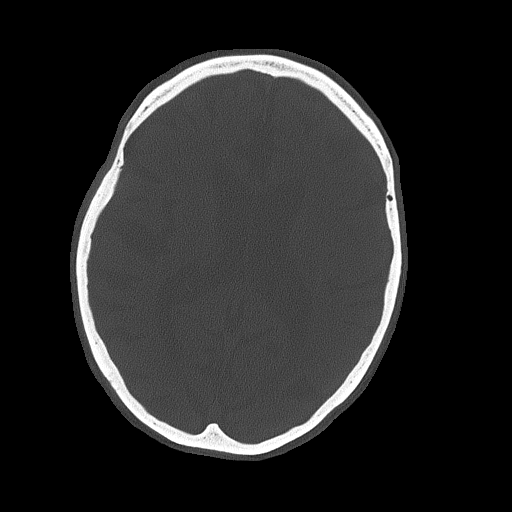
[im 48/76  bone]
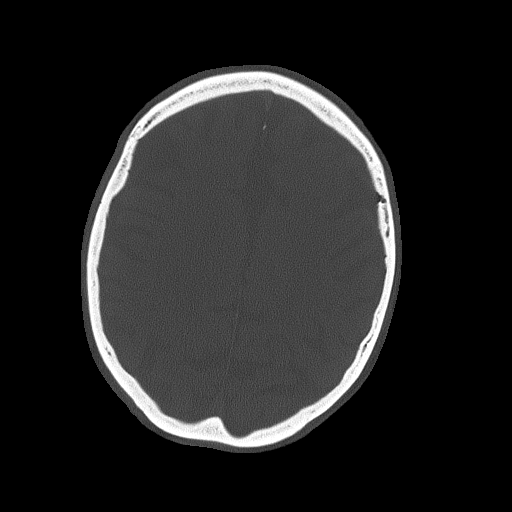
[im 62/76  bone]
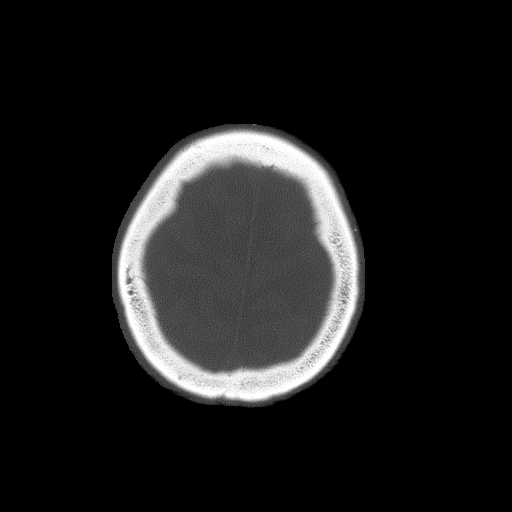
[im 69/76  bone]
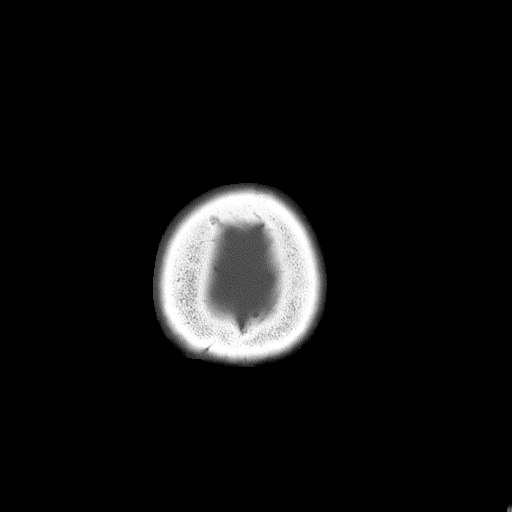

[Series 6: head without cor · coronal · non-contrast · 0.29mm/px · 3 of 62 slices shown]
[im 21/62  brain]
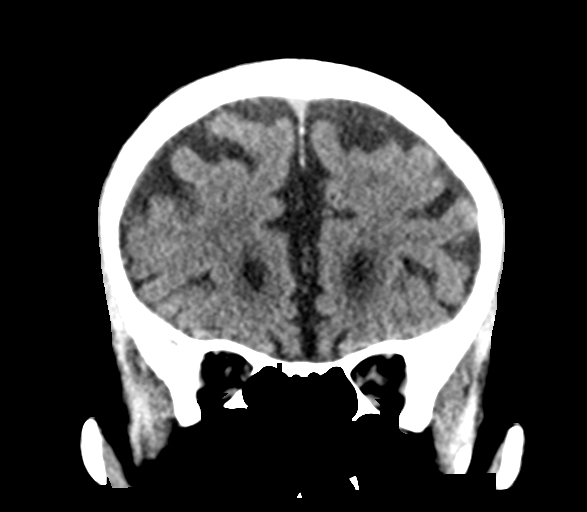
[im 31/62  brain]
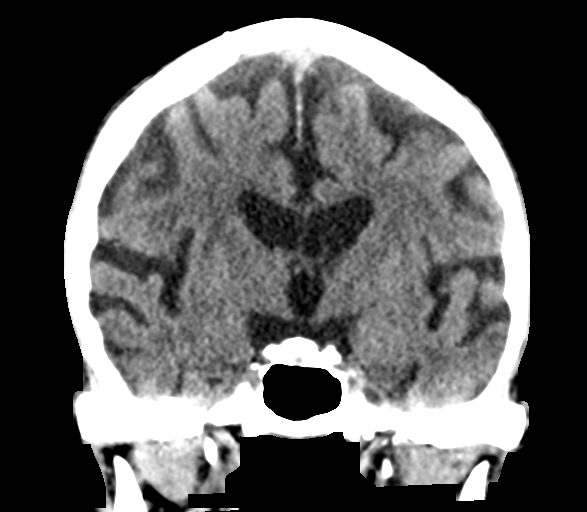
[im 41/62  brain]
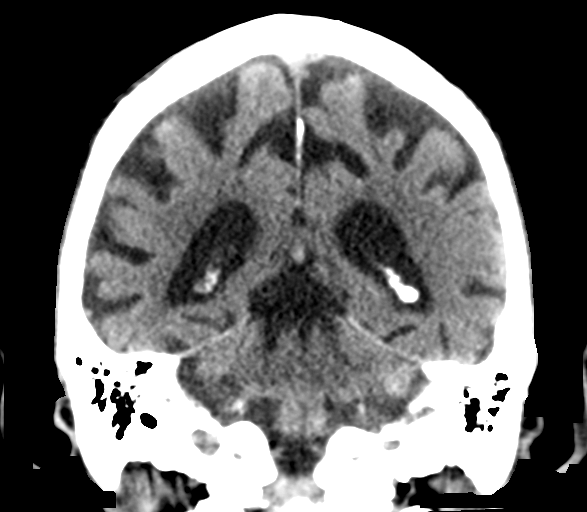

[Series 7: head without sag · sagittal · non-contrast · 0.29mm/px · 1 of 52 slices shown]
[im 26/52  brain]
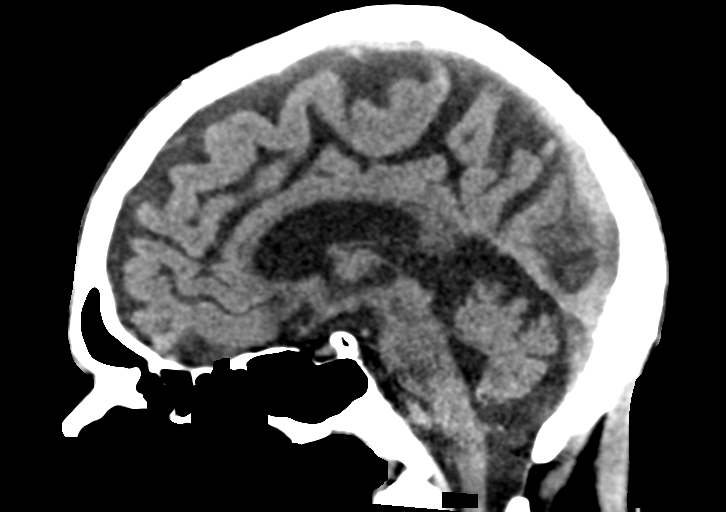

[Series 8: c_spine 2.0 st · axial · 0.33mm/px · z∈[-315,-287]mm · 2 of 82 slices shown]
[im 7/82  brain]
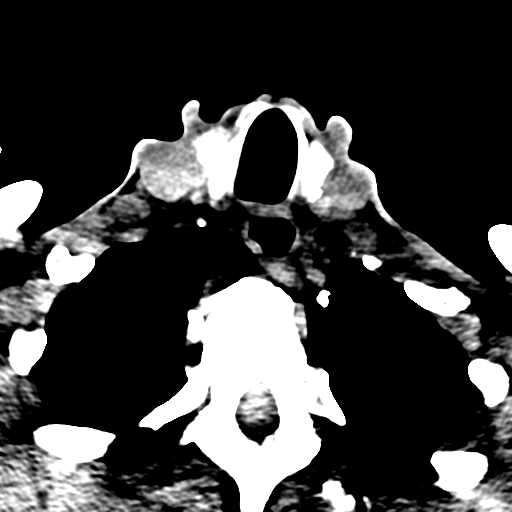
[im 21/82  brain]
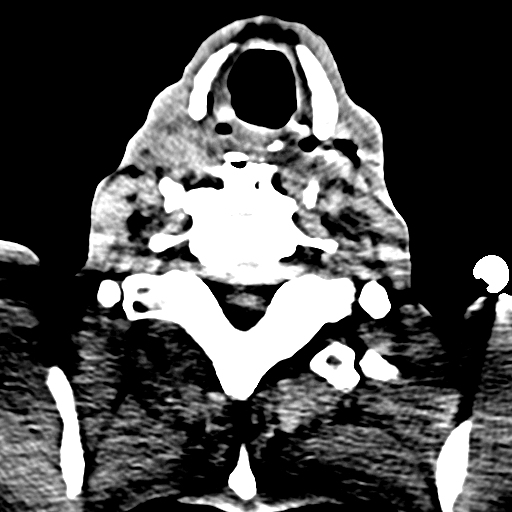

[17 of 47 positions shown; findings below may reference images not displayed]

FINDINGS: CT HEAD FINDINGS

Brain:

There is no intracranial hemorrhage, mass or evidence of acute
infarction. There is moderate generalized atrophy. There is mild
chronic microvascular ischemic change. There is no significant
extra-axial fluid collection.

No acute intracranial findings are evident.

Vascular: No hyperdense vessel or unexpected calcification.

Skull: Normal. Negative for fracture or focal lesion.

Sinuses/Orbits: No acute finding.

Other: None.

CT CERVICAL SPINE FINDINGS

Alignment: Normal.

Skull base and vertebrae: No acute fracture. No primary bone lesion
or focal pathologic process.

Soft tissues and spinal canal: No prevertebral fluid or swelling. No
visible canal hematoma.

Disc levels: Moderate cervical degenerative disc disease. Moderate
facet arthritis.

Upper chest: Negative.

Other: None
IMPRESSION: 1. No acute intracranial findings. There is moderate generalized
atrophy and chronic appearing white matter hypodensities which
likely represent small vessel ischemic disease.
2. Negative for acute cervical spine fracture.
